# Patient Record
Sex: Male | Born: 1970
Health system: Southern US, Community
[De-identification: ages and names within clinical notes are randomized; demographics above are authoritative.]

## PROBLEM LIST (undated history)

## (undated) DIAGNOSIS — I10 Essential (primary) hypertension: Secondary | ICD-10-CM

## (undated) DIAGNOSIS — Z5189 Encounter for other specified aftercare: Secondary | ICD-10-CM

## (undated) DIAGNOSIS — K5790 Diverticulosis of intestine, part unspecified, without perforation or abscess without bleeding: Secondary | ICD-10-CM

## (undated) DIAGNOSIS — E7800 Pure hypercholesterolemia, unspecified: Secondary | ICD-10-CM

## (undated) DIAGNOSIS — E782 Mixed hyperlipidemia: Secondary | ICD-10-CM

## (undated) DIAGNOSIS — Z8639 Personal history of other endocrine, nutritional and metabolic disease: Secondary | ICD-10-CM

## (undated) HISTORY — PX: EYE SURGERY: SHX253

## (undated) HISTORY — DX: Diverticulosis of intestine, part unspecified, without perforation or abscess without bleeding: K57.90

## (undated) HISTORY — DX: Mixed hyperlipidemia: E78.2

## (undated) HISTORY — DX: Personal history of other endocrine, nutritional and metabolic disease: Z86.39

## (undated) HISTORY — DX: Essential (primary) hypertension: I10

## (undated) HISTORY — DX: Encounter for other specified aftercare: Z51.89

## (undated) HISTORY — PX: UPPER GASTROINTESTINAL ENDOSCOPY: SHX188

## (undated) HISTORY — DX: Pure hypercholesterolemia, unspecified: E78.00

---
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
## 1989-02-21 HISTORY — PX: POSTERIOR CRUCIATE LIGAMENT RECONSTRUCTION: SHX749

## 1998-02-21 DIAGNOSIS — K5733 Diverticulitis of large intestine without perforation or abscess with bleeding: Secondary | ICD-10-CM | POA: Insufficient documentation

## 1998-02-21 HISTORY — PX: COLONOSCOPY: SHX174

## 1998-02-21 HISTORY — PX: COLECTOMY: SHX59

## 1998-02-21 HISTORY — DX: Diverticulitis of large intestine without perforation or abscess with bleeding: K57.33

## 2017-01-11 LAB — HEPATIC FUNCTION PANEL
ALT: 45 — AB (ref 10–40)
AST: 23 (ref 14–40)

## 2017-01-11 LAB — LIPID PANEL
Cholesterol: 147 (ref 0–200)
HDL: 29 — AB (ref 35–70)
LDL Cholesterol: 81

## 2017-01-11 LAB — BASIC METABOLIC PANEL
Creatinine: 0.9 (ref 0.6–1.3)
Glucose: 114

## 2017-01-11 LAB — TSH: TSH: 0.35 — AB (ref 0.41–5.90)

## 2017-01-11 LAB — HEMOGLOBIN A1C: Hemoglobin A1C: 5.5

## 2018-04-25 ENCOUNTER — Encounter: Payer: Self-pay | Admitting: Osteopathic Medicine

## 2018-04-25 ENCOUNTER — Ambulatory Visit: Payer: BLUE CROSS/BLUE SHIELD | Admitting: Osteopathic Medicine

## 2018-04-25 VITALS — BP 145/90 | HR 78 | Temp 98.2°F | Ht 78.0 in | Wt 300.6 lb

## 2018-04-25 DIAGNOSIS — Z8639 Personal history of other endocrine, nutritional and metabolic disease: Secondary | ICD-10-CM | POA: Insufficient documentation

## 2018-04-25 DIAGNOSIS — G8929 Other chronic pain: Secondary | ICD-10-CM

## 2018-04-25 DIAGNOSIS — E782 Mixed hyperlipidemia: Secondary | ICD-10-CM

## 2018-04-25 DIAGNOSIS — Z23 Encounter for immunization: Secondary | ICD-10-CM

## 2018-04-25 DIAGNOSIS — I10 Essential (primary) hypertension: Secondary | ICD-10-CM

## 2018-04-25 DIAGNOSIS — K5790 Diverticulosis of intestine, part unspecified, without perforation or abscess without bleeding: Secondary | ICD-10-CM | POA: Diagnosis not present

## 2018-04-25 DIAGNOSIS — M545 Low back pain: Secondary | ICD-10-CM

## 2018-04-25 HISTORY — DX: Personal history of other endocrine, nutritional and metabolic disease: Z86.39

## 2018-04-25 HISTORY — DX: Essential (primary) hypertension: I10

## 2018-04-25 HISTORY — DX: Mixed hyperlipidemia: E78.2

## 2018-04-25 HISTORY — DX: Diverticulosis of intestine, part unspecified, without perforation or abscess without bleeding: K57.90

## 2018-04-25 MED ORDER — ROSUVASTATIN CALCIUM 20 MG PO TABS: 20.0000 mg | ORAL_TABLET | Freq: Every day | ORAL | 3 refills | Status: DC

## 2018-04-25 MED ORDER — NIACIN ER (ANTIHYPERLIPIDEMIC) 1000 MG PO TBCR: 1000.0000 mg | EXTENDED_RELEASE_TABLET | Freq: Every day | ORAL | 3 refills | Status: DC

## 2018-04-25 MED ORDER — METOPROLOL SUCCINATE ER 25 MG PO TB24: 25.0000 mg | ORAL_TABLET | Freq: Every day | ORAL | 3 refills | Status: DC

## 2018-04-25 MED ORDER — LISINOPRIL-HYDROCHLOROTHIAZIDE 20-25 MG PO TABS: 1.0000 | ORAL_TABLET | Freq: Every day | ORAL | 3 refills | Status: DC

## 2018-04-25 NOTE — Progress Notes (Signed)
HPI: Dylan Vaughn is a 48 y.o. male who  has a past medical history of Diverticular disease (04/25/2018), Essential hypertension (04/25/2018), High blood pressure, High cholesterol, History of thyroid disorder (04/25/2018), and Mixed hyperlipidemia (04/25/2018).  he presents to [facility] today, 04/25/18,  for chief complaint of: New to establish   Very pleasant new patient here to establish care.  Goes by [name].  Works as a Water quality scientist.  He is married.  Fatigue: Ongoing, on and off.  No concerns with depression.  Has noticed decreased libido.  Abnormal blood work: Reports recent blood draw at work showed abnormal fasting glucose, also checked total cholesterol, HDL.  Thyroid: States there was some kind of history of nodules, he cannot remember if thyroid was low functioning or high functioning.  Hypertension: Currently on lisinopril-HCTZ 20-25 and Toprol XL 25 mg daily.  Hyperlipidemia: Taking rosuvastatin 20 mg and niacin 1 g, both in the evenings.   Brief review of preventive care: Never smoker, drinks alcohol on the weekends, maximum of 6/day or so.  Never drug use.  Identifies as cis gender heterosexual male.  Married, monogamous.  Declines STD testing.  Status post vasectomy.  No family history of colon cancer, prostate cancer.  Strong family history of cardiac disease, father had first heart attack at age 79 and was deceased at age 19.     Past medical, surgical, social and family history reviewed:  Patient Active Problem List   Diagnosis Date Noted  . Essential hypertension 04/25/2018  . Mixed hyperlipidemia 04/25/2018  . Diverticular disease 04/25/2018  . History of thyroid disorder 04/25/2018    Past Surgical History:  Procedure Laterality Date  . COLECTOMY  2000  . EYE SURGERY    . POSTERIOR CRUCIATE LIGAMENT RECONSTRUCTION  1991    Social History   Tobacco Use  . Smoking status: Never Smoker  . Smokeless tobacco: Never Used   Substance Use Topics  . Alcohol use: Yes    Alcohol/week: 6.0 standard drinks    Types: 6 Standard drinks or equivalent per week    Family History  Problem Relation Age of Onset  . High blood pressure Father   . Heart attack Father      Current medication list and allergy/intolerance information reviewed:    Current Outpatient Medications  Medication Sig Dispense Refill  . lisinopril-hydrochlorothiazide (PRINZIDE,ZESTORETIC) 20-25 MG tablet Take 1 tablet by mouth daily. 90 tablet 3  . metoprolol succinate (TOPROL-XL) 25 MG 24 hr tablet Take 1 tablet (25 mg total) by mouth daily. 90 tablet 3  . niacin (NIASPAN) 1000 MG CR tablet Take 1 tablet (1,000 mg total) by mouth at bedtime. 90 tablet 3  . rosuvastatin (CRESTOR) 20 MG tablet Take 1 tablet (20 mg total) by mouth at bedtime. 90 tablet 3   No current facility-administered medications for this visit.     Allergies  Allergen Reactions  . Belladonna Alkaloids Other (See Comments)    Tachycardia -  as a child      Review of Systems:  Constitutional:  No  fever, no chills, No recent illness, No unintentional weight changes. +significant fatigue.   HEENT: No  headache, no vision change, no hearing change, No sore throat, No  sinus pressure  Cardiac: No  chest pain, No  pressure, No palpitations, No  Orthopnea  Respiratory:  No  shortness of breath. No  Cough  Gastrointestinal: No  abdominal pain, No  nausea, No  vomiting,  No  blood in  stool, No  diarrhea, No  constipation   Musculoskeletal: No new myalgia/arthralgia, +LBP  Skin: No  Rash, No other wounds/concerning lesions  Genitourinary: No  incontinence, No  abnormal genital bleeding, No abnormal genital discharge  Hem/Onc: No  easy bruising/bleeding, No  abnormal lymph node  Endocrine: No cold intolerance,  No heat intolerance. No polyuria/polydipsia/polyphagia   Neurologic: No  weakness, No  dizziness, No  slurred speech/focal weakness/facial  droop  Psychiatric: No  concerns with depression, No  concerns with anxiety, No sleep problems, No mood problems  Exam:  BP (!) 145/90 (BP Location: Left Arm, Patient Position: Sitting, Cuff Size: Large)   Pulse 78   Temp 98.2 F (36.8 C) (Oral)   Ht 6\' 6"  (1.981 m)   Wt (!) 300 lb 9.6 oz (136.4 kg)   BMI 34.74 kg/m   Constitutional: VS see above. General Appearance: alert, well-developed, well-nourished, NAD  Eyes: Normal lids and conjunctive, non-icteric sclera  Ears, Nose, Mouth, Throat: MMM, Normal external inspection ears/nares/mouth/lips/gums. TM normal bilaterally. Pharynx/tonsils no erythema, no exudate. Nasal mucosa normal.   Neck: No masses, trachea midline. No thyroid enlargement. No tenderness/mass appreciated. No lymphadenopathy  Respiratory: Normal respiratory effort. no wheeze, no rhonchi, no rales  Cardiovascular: S1/S2 normal, no murmur, no rub/gallop auscultated. RRR. No lower extremity edema. Pedal pulse II/IV bilaterally DP and PT.   Gastrointestinal: Nontender, no masses. No hepatomegaly, no splenomegaly. No hernia appreciated. Bowel sounds normal. Rectal exam deferred.   Musculoskeletal: Gait normal. No clubbing/cyanosis of digits.   Neurological: Normal balance/coordination. No tremor. No cranial nerve deficit on limited exam. Motor and sensation intact and symmetric. Cerebellar reflexes intact.   Skin: warm, dry, intact. No rash/ulcer. No concerning nevi or subq nodules on limited exam.    Psychiatric: Normal judgment/insight. Normal mood and affect. Oriented x3.      ASSESSMENT/PLAN: The primary encounter diagnosis was Essential hypertension. Diagnoses of Need for Tdap vaccination, Mixed hyperlipidemia, Diverticular disease, Chronic bilateral low back pain without sciatica, and History of thyroid disorder were also pertinent to this visit.   Orders Placed This Encounter  Procedures  . Tdap vaccine greater than or equal to 7yo IM  . CBC  .  COMPLETE METABOLIC PANEL WITH GFR  . Lipid panel  . TSH  . T4, free  . Hemoglobin A1c  . Testosterone  . Urinalysis, Routine w reflex microscopic    Meds ordered this encounter  Medications  . lisinopril-hydrochlorothiazide (PRINZIDE,ZESTORETIC) 20-25 MG tablet    Sig: Take 1 tablet by mouth daily.    Dispense:  90 tablet    Refill:  3  . metoprolol succinate (TOPROL-XL) 25 MG 24 hr tablet    Sig: Take 1 tablet (25 mg total) by mouth daily.    Dispense:  90 tablet    Refill:  3  . niacin (NIASPAN) 1000 MG CR tablet    Sig: Take 1 tablet (1,000 mg total) by mouth at bedtime.    Dispense:  90 tablet    Refill:  3  . rosuvastatin (CRESTOR) 20 MG tablet    Sig: Take 1 tablet (20 mg total) by mouth at bedtime.    Dispense:  90 tablet    Refill:  3    Patient Instructions  Plan:  Will get labs - fasting, AM As long as labs ok, go back to "regular dose" blood pressure medicines We can have a nurse check blood pressure in a couple weeks to make sure you're at goal (130/80 or less)  OR Can come see me if any significant abnormality on labs which requires follow-up, or if you want to discuss results in detail  If you're doing well, we can recheck "officially" in a year or sooner as needed, but usually I like to know how the blood pressure is looking every 6 months or so. We can schedule you with the nurse (quick visit) for BP check if needed.   Let's get some labs back, and will go from there!           Visit summary with medication list and pertinent instructions was printed for patient to review. All questions at time of visit were answered - patient instructed to contact office with any additional concerns or updates. ER/RTC precautions were reviewed with the patient.  \  Please note: voice recognition software was used to produce this document, and typos may escape review. Please contact Dr. Sheppard Coil for any needed clarifications.     Follow-up plan: Return for  recheck depending on labs / nurse visit in 2 weeks for BP recheck back on meds .

## 2018-04-25 NOTE — Patient Instructions (Addendum)
Plan:  Will get labs - fasting, AM As long as labs ok, go back to "regular dose" blood pressure medicines We can have a nurse check blood pressure in a couple weeks to make sure you're at goal (130/80 or less)  OR Can come see me if any significant abnormality on labs which requires follow-up, or if you want to discuss results in detail  If you're doing well, we can recheck "officially" in a year or sooner as needed, but usually I like to know how the blood pressure is looking every 6 months or so. We can schedule you with the nurse (quick visit) for BP check if needed.   Let's get some labs back, and will go from there!

## 2018-04-27 ENCOUNTER — Encounter: Payer: Self-pay | Admitting: Osteopathic Medicine

## 2018-04-28 LAB — URINALYSIS, ROUTINE W REFLEX MICROSCOPIC
Bacteria, UA: NONE SEEN /HPF
Bilirubin Urine: NEGATIVE
Glucose, UA: NEGATIVE
Hgb urine dipstick: NEGATIVE
Hyaline Cast: NONE SEEN /LPF
Ketones, ur: NEGATIVE
Nitrite: NEGATIVE
Protein, ur: NEGATIVE
RBC / HPF: NONE SEEN /HPF (ref 0–2)
SPECIFIC GRAVITY, URINE: 1.02 (ref 1.001–1.03)
pH: 6.5 (ref 5.0–8.0)

## 2018-04-28 LAB — CBC
HCT: 45.9 % (ref 38.5–50.0)
Hemoglobin: 15.8 g/dL (ref 13.2–17.1)
MCH: 29.7 pg (ref 27.0–33.0)
MCHC: 34.4 g/dL (ref 32.0–36.0)
MCV: 86.3 fL (ref 80.0–100.0)
MPV: 10.8 fL (ref 7.5–12.5)
Platelets: 274 10*3/uL (ref 140–400)
RBC: 5.32 10*6/uL (ref 4.20–5.80)
RDW: 12.8 % (ref 11.0–15.0)
WBC: 8.1 10*3/uL (ref 3.8–10.8)

## 2018-04-28 LAB — LIPID PANEL
CHOL/HDL RATIO: 4.9 (calc) (ref <5.0)
Cholesterol: 156 mg/dL (ref <200)
HDL: 32 mg/dL — ABNORMAL LOW (ref >=40)
LDL Cholesterol (Calc): 95 mg/dL (calc)
Non-HDL Cholesterol (Calc): 124 mg/dL (calc) (ref <130)
Triglycerides: 203 mg/dL — ABNORMAL HIGH (ref <150)

## 2018-04-28 LAB — TESTOSTERONE: Testosterone: 445 ng/dL (ref 250–827)

## 2018-04-28 LAB — COMPLETE METABOLIC PANEL WITH GFR
AG Ratio: 1.7 (calc) (ref 1.0–2.5)
ALT: 28 U/L (ref 9–46)
AST: 19 U/L (ref 10–40)
Albumin: 4.3 g/dL (ref 3.6–5.1)
Alkaline phosphatase (APISO): 52 U/L (ref 36–130)
BUN: 10 mg/dL (ref 7–25)
CO2: 31 mmol/L (ref 20–32)
Calcium: 9.4 mg/dL (ref 8.6–10.3)
Chloride: 100 mmol/L (ref 98–110)
Creat: 0.98 mg/dL (ref 0.60–1.35)
GFR, Est African American: 104 mL/min/{1.73_m2} (ref >=60)
GFR, Est Non African American: 90 mL/min/{1.73_m2} (ref >=60)
Globulin: 2.6 g/dL (calc) (ref 1.9–3.7)
Glucose, Bld: 110 mg/dL — ABNORMAL HIGH (ref 65–99)
Potassium: 4.3 mmol/L (ref 3.5–5.3)
SODIUM: 139 mmol/L (ref 135–146)
Total Bilirubin: 0.9 mg/dL (ref 0.2–1.2)
Total Protein: 6.9 g/dL (ref 6.1–8.1)

## 2018-04-28 LAB — HEMOGLOBIN A1C
EAG (MMOL/L): 6.5 (calc)
Hgb A1c MFr Bld: 5.7 % of total Hgb — ABNORMAL HIGH (ref <5.7)
Mean Plasma Glucose: 117 (calc)

## 2018-04-28 LAB — T4, FREE: Free T4: 0.9 ng/dL (ref 0.8–1.8)

## 2018-04-28 LAB — TSH: TSH: 0.54 mIU/L (ref 0.40–4.50)

## 2018-04-30 ENCOUNTER — Encounter: Payer: Self-pay | Admitting: Osteopathic Medicine

## 2018-04-30 DIAGNOSIS — R7309 Other abnormal glucose: Secondary | ICD-10-CM

## 2018-04-30 HISTORY — DX: Other abnormal glucose: R73.09

## 2018-05-22 ENCOUNTER — Telehealth: Payer: Self-pay | Admitting: Osteopathic Medicine

## 2018-05-22 DIAGNOSIS — R7302 Impaired glucose tolerance (oral): Secondary | ICD-10-CM

## 2018-05-22 DIAGNOSIS — Z8639 Personal history of other endocrine, nutritional and metabolic disease: Secondary | ICD-10-CM

## 2018-05-22 HISTORY — DX: Impaired glucose tolerance (oral): R73.02

## 2018-05-22 NOTE — Telephone Encounter (Signed)
As reviewed from previous provider in [place].

## 2018-05-31 ENCOUNTER — Encounter: Payer: Self-pay | Admitting: Osteopathic Medicine

## 2018-08-21 ENCOUNTER — Encounter: Payer: Self-pay | Admitting: Osteopathic Medicine

## 2018-08-21 ENCOUNTER — Ambulatory Visit (INDEPENDENT_AMBULATORY_CARE_PROVIDER_SITE_OTHER): Payer: BC Managed Care – PPO | Admitting: Osteopathic Medicine

## 2018-08-21 VITALS — BP 140/85 | HR 79 | Temp 98.4°F | Wt 307.3 lb

## 2018-08-21 DIAGNOSIS — E781 Pure hyperglyceridemia: Secondary | ICD-10-CM

## 2018-08-21 DIAGNOSIS — R7309 Other abnormal glucose: Secondary | ICD-10-CM

## 2018-08-21 DIAGNOSIS — E782 Mixed hyperlipidemia: Secondary | ICD-10-CM

## 2018-08-21 DIAGNOSIS — I10 Essential (primary) hypertension: Secondary | ICD-10-CM | POA: Diagnosis not present

## 2018-08-21 HISTORY — DX: Pure hyperglyceridemia: E78.1

## 2018-08-21 NOTE — Progress Notes (Signed)
HPI: Dylan Vaughn is a 48 y.o. male who  has a past medical history of Diverticular disease (04/25/2018), Essential hypertension (04/25/2018), High blood pressure, High cholesterol, History of thyroid disorder (04/25/2018), and Mixed hyperlipidemia (04/25/2018).  he presents to [facility] today, 08/21/18,  for chief complaint of:  Follow-up HTN   Bit upset today, there was a good with scheduling and he was here early and waiting for awhile.   No CP/SOB, doing well on current meds.   Reviewed labs, all questions answered.   A1C was prediabetic range.    BP Readings from Last 3 Encounters:  08/21/18 140/85  04/25/18 (!) 145/90       At today's visit 08/21/18 ... PMH, PSH, FH reviewed and updated as needed.  Current medication list and allergy/intolerance hx reviewed and updated as needed. (See remainder of HPI, ROS, Phys Exam below)          ASSESSMENT/PLAN: The primary encounter diagnosis was Elevated hemoglobin A1c measurement. Diagnoses of Essential hypertension, Mixed hyperlipidemia, and Hypertriglyceridemia were also pertinent to this visit.   Work on [employer] and exercise   Pt will get a home BP monitor and check at home, a bit stressed today so we'll let BP slide but don't want to ignore it   Patient Instructions  BP goal: 130 top number, 80 bottom number or less       Follow-up plan: Return in about 3 months (around 11/21/2018) for recheck A1C (prediabetic range in 04/2018) - see me sooner if needed.                                                 ################################################# ################################################# ################################################# #################################################    Current Meds  Medication Sig  . lisinopril-hydrochlorothiazide (PRINZIDE,ZESTORETIC) 20-25 MG tablet Take 1 tablet by mouth  daily.  . metoprolol succinate (TOPROL-XL) 25 MG 24 hr tablet Take 1 tablet (25 mg total) by mouth daily.  . niacin (NIASPAN) 1000 MG CR tablet Take 1 tablet (1,000 mg total) by mouth at bedtime.  . rosuvastatin (CRESTOR) 20 MG tablet Take 1 tablet (20 mg total) by mouth at bedtime.    Allergies  Allergen Reactions  . Belladonna Alkaloids Other (See Comments)    Tachycardia -  as a child       Review of Systems:  Constitutional: No recent illness  HEENT: No  headache, no vision change  Cardiac: No  chest pain, No  pressure, No palpitations  Respiratory:  No  shortness of breath. No  Cough  Gastrointestinal: No  abdominal pain, no change on bowel habits  Musculoskeletal: No new myalgia/arthralgia  Skin: No  Rash  Psychiatric: No  concerns with depression, No  concerns with anxiety  Exam:  BP 140/85   Pulse 79   Temp 98.4 F (36.9 C) (Oral)   Wt (!) 307 lb 4.8 oz (139.4 kg)   BMI 35.51 kg/m   Constitutional: VS see above. General Appearance: alert, well-developed, well-nourished, NAD  Eyes: Normal lids and conjunctive, non-icteric sclera  Ears, Nose, Mouth, Throat: MMM, Normal external inspection ears/nares/mouth/lips/gums.  Neck: No masses, trachea midline.   Respiratory: Normal respiratory effort.  Musculoskeletal: Gait normal. Symmetric and independent movement of all extremities  Neurological: Normal balance/coordination. No tremor.  Skin: warm, dry, intact.   Psychiatric: Normal judgment/insight. Normal mood and affect. Oriented x3.  Visit summary with medication list and pertinent instructions was printed for patient to review, patient was advised to alert [place] if any updates are needed. All questions at time of visit were answered - patient instructed to contact office with any additional concerns. ER/RTC precautions were reviewed with the patient and understanding verbalized.     Please note: voice recognition software was used to produce  this document, and typos may escape review. Please contact Dr. Sheppard Coil for any needed clarifications.    Follow up plan: Return in about 3 months (around 11/21/2018) for recheck A1C (prediabetic range in 04/2018) - see me sooner if needed.

## 2018-08-21 NOTE — Patient Instructions (Signed)
BP goal: 130 top number, 80 bottom number or less

## 2018-10-31 DIAGNOSIS — Z20828 Contact with and (suspected) exposure to other viral communicable diseases: Secondary | ICD-10-CM | POA: Diagnosis not present

## 2018-11-21 ENCOUNTER — Ambulatory Visit: Payer: BC Managed Care – PPO | Admitting: Osteopathic Medicine

## 2018-11-26 ENCOUNTER — Telehealth: Payer: Self-pay | Admitting: Osteopathic Medicine

## 2018-11-26 DIAGNOSIS — E782 Mixed hyperlipidemia: Secondary | ICD-10-CM

## 2018-11-26 DIAGNOSIS — R7309 Other abnormal glucose: Secondary | ICD-10-CM

## 2018-11-26 NOTE — Telephone Encounter (Signed)
Patient was wanting lab order placed prior to his visit on 12/26/2018. Please contact patient when orders are placed. Please Advise.

## 2018-11-27 ENCOUNTER — Ambulatory Visit: Payer: BC Managed Care – PPO | Admitting: Osteopathic Medicine

## 2018-11-27 NOTE — Telephone Encounter (Signed)
Taken care of

## 2018-11-27 NOTE — Telephone Encounter (Signed)
Left a detailed vm msg for pt regarding lab order. Direct call back info provided.  

## 2018-11-29 ENCOUNTER — Other Ambulatory Visit: Payer: Self-pay

## 2018-11-29 DIAGNOSIS — Z20822 Contact with and (suspected) exposure to covid-19: Secondary | ICD-10-CM

## 2018-11-29 DIAGNOSIS — Z20828 Contact with and (suspected) exposure to other viral communicable diseases: Secondary | ICD-10-CM | POA: Diagnosis not present

## 2018-11-30 LAB — NOVEL CORONAVIRUS, NAA: SARS-CoV-2, NAA: NOT DETECTED

## 2018-12-25 LAB — COMPLETE METABOLIC PANEL WITH GFR
AG Ratio: 1.6 (calc) (ref 1.0–2.5)
ALT: 30 U/L (ref 9–46)
AST: 20 U/L (ref 10–35)
Albumin: 4.3 g/dL (ref 3.6–5.1)
Alkaline phosphatase (APISO): 49 U/L (ref 35–144)
BUN: 9 mg/dL (ref 7–25)
CO2: 29 mmol/L (ref 20–32)
Calcium: 9.1 mg/dL (ref 8.6–10.3)
Chloride: 101 mmol/L (ref 98–110)
Creat: 0.78 mg/dL (ref 0.70–1.33)
GFR, Est African American: 122 mL/min/{1.73_m2} (ref >=60)
GFR, Est Non African American: 105 mL/min/{1.73_m2} (ref >=60)
Globulin: 2.7 g/dL (calc) (ref 1.9–3.7)
Glucose, Bld: 129 mg/dL — ABNORMAL HIGH (ref 65–99)
Potassium: 4 mmol/L (ref 3.5–5.3)
Sodium: 140 mmol/L (ref 135–146)
Total Bilirubin: 1 mg/dL (ref 0.2–1.2)
Total Protein: 7 g/dL (ref 6.1–8.1)

## 2018-12-25 LAB — LIPID PANEL
Cholesterol: 128 mg/dL (ref <200)
HDL: 38 mg/dL — ABNORMAL LOW (ref >=40)
LDL Cholesterol (Calc): 62 mg/dL (calc)
Non-HDL Cholesterol (Calc): 90 mg/dL (calc) (ref <130)
Total CHOL/HDL Ratio: 3.4 (calc) (ref <5.0)
Triglycerides: 223 mg/dL — ABNORMAL HIGH (ref <150)

## 2018-12-25 LAB — HEMOGLOBIN A1C
Hgb A1c MFr Bld: 5.4 % of total Hgb (ref <5.7)
Mean Plasma Glucose: 108 (calc)
eAG (mmol/L): 6 (calc)

## 2018-12-26 ENCOUNTER — Encounter: Payer: Self-pay | Admitting: Gastroenterology

## 2018-12-26 ENCOUNTER — Encounter: Payer: Self-pay | Admitting: Osteopathic Medicine

## 2018-12-26 ENCOUNTER — Ambulatory Visit (INDEPENDENT_AMBULATORY_CARE_PROVIDER_SITE_OTHER): Payer: BC Managed Care – PPO | Admitting: Osteopathic Medicine

## 2018-12-26 VITALS — BP 136/85 | HR 75 | Wt 305.0 lb

## 2018-12-26 DIAGNOSIS — E781 Pure hyperglyceridemia: Secondary | ICD-10-CM

## 2018-12-26 DIAGNOSIS — Z1211 Encounter for screening for malignant neoplasm of colon: Secondary | ICD-10-CM | POA: Diagnosis not present

## 2018-12-26 DIAGNOSIS — Z9049 Acquired absence of other specified parts of digestive tract: Secondary | ICD-10-CM

## 2018-12-26 DIAGNOSIS — R7302 Impaired glucose tolerance (oral): Secondary | ICD-10-CM

## 2018-12-26 DIAGNOSIS — R7309 Other abnormal glucose: Secondary | ICD-10-CM | POA: Diagnosis not present

## 2018-12-26 DIAGNOSIS — Z0000 Encounter for general adult medical examination without abnormal findings: Secondary | ICD-10-CM

## 2018-12-26 MED ORDER — ICOSAPENT ETHYL 1 G PO CAPS: 1.0000 | ORAL_CAPSULE | Freq: Two times a day (BID) | ORAL | 3 refills | Status: DC

## 2018-12-26 NOTE — Progress Notes (Signed)
Virtual Visit via Video (App used: Doximity) Note  I connected with      Dylan Vaughn on 12/26/18 at 8:53 AM by a telemedicine application and verified that I am speaking with the correct person using two identifiers.  Patient is at home I am working form home    I discussed the limitations of evaluation and management by telemedicine and the availability of in person appointments. The patient expressed understanding and agreed to proceed.  History of Present Illness: Dylan Vaughn is a 48 y.o. male who would like to discuss recheck A1C, cholesterol, discuss colon cancer screening   A1C went from 5.7 in 04/2000 to 5.4 this month 12/2018. Doing well!  Lipids: better, HDL is highest it's been for him based onhis records going back several years. He also has had TG 470+ and this wsa iniial reason for starting the Niacin, which has helped but TG are still fairly high.   48 yo now! Would like to discuss colonoscopy. Hx colectomy d/t ruptured diverticulum.       Observations/Objective: There were no vitals taken for this visit. BP Readings from Last 3 Encounters:  08/21/18 140/85  04/25/18 (!) 145/90   Exam: Normal Speech.  NAD  Lab and Radiology Results Results for orders placed or performed in visit on 11/26/18 (from the past 72 hour(s))  Lipid Profile     Status: Abnormal   Collection Time: 12/24/18  8:20 AM  Result Value Ref Range   Cholesterol 128 <200 mg/dL   HDL 38 (L) > OR = 40 mg/dL   Triglycerides 223 (H) <150 mg/dL    Comment: . If a non-fasting specimen was collected, consider repeat triglyceride testing on a fasting specimen if clinically indicated.  Yates Decamp et al. J. of Clin. Lipidol. [id number]. Marland Kitchen    LDL Cholesterol (Calc) 62 mg/dL (calc)    Comment: Reference range: <100 . Desirable range <100 mg/dL for primary prevention;   <70 mg/dL for patients with CHD or diabetic patients  with > or = 2 CHD risk factors. Marland Kitchen LDL-C is now calculated using  the Martin-Hopkins  calculation, which is a validated novel method providing  better accuracy than the Friedewald equation in the  estimation of LDL-C.  Cresenciano Genre et al. Annamaria Helling. [id number]): 2061-2068  (http://education.[facility].com/faq/FAQ164)    Total CHOL/HDL Ratio 3.4 <5.0 (calc)   Non-HDL Cholesterol (Calc) 90 <130 mg/dL (calc)    Comment: For patients with diabetes plus 1 major ASCVD risk  factor, treating to a non-HDL-C goal of <100 mg/dL  (LDL-C of <70 mg/dL) is considered a therapeutic  option.   COMPLETE METABOLIC PANEL WITH GFR     Status: Abnormal   Collection Time: 12/24/18  8:20 AM  Result Value Ref Range   Glucose, Bld 129 (H) 65 - 99 mg/dL    Comment: .            Fasting reference interval . For someone without known diabetes, a glucose value >125 mg/dL indicates that they may have diabetes and this should be confirmed with a follow-up test. .    BUN 9 7 - 25 mg/dL   Creat 0.78 0.70 - 1.33 mg/dL    Comment: For patients >48 years of age, the reference limit for Creatinine is approximately 13% higher for people identified as African-American. .    GFR, Est Non African American 105 > OR = 60 mL/min/1.54m2   GFR, Est African American 122 > OR = 60 mL/min/1.9m2   BUN/Creatinine Ratio NOT  APPLICABLE 6 - 22 (calc)   Sodium 140 135 - 146 mmol/L   Potassium 4.0 3.5 - 5.3 mmol/L   Chloride 101 98 - 110 mmol/L   CO2 29 20 - 32 mmol/L   Calcium 9.1 8.6 - 10.3 mg/dL   Total Protein 7.0 6.1 - 8.1 g/dL   Albumin 4.3 3.6 - 5.1 g/dL   Globulin 2.7 1.9 - 3.7 g/dL (calc)   AG Ratio 1.6 1.0 - 2.5 (calc)   Total Bilirubin 1.0 0.2 - 1.2 mg/dL   Alkaline phosphatase (APISO) 49 35 - 144 U/L   AST 20 10 - 35 U/L   ALT 30 9 - 46 U/L  HgB A1c     Status: None   Collection Time: 12/24/18  8:20 AM  Result Value Ref Range   Hgb A1c MFr Bld 5.4 <5.7 % of total Hgb    Comment: For the purpose of screening for the presence of diabetes: . <5.7%       Consistent with  the absence of diabetes 5.7-6.4%    Consistent with increased risk for diabetes             (prediabetes) > or =6.5%  Consistent with diabetes . This assay result is consistent with a decreased risk of diabetes. . Currently, no consensus exists regarding use of hemoglobin A1c for diagnosis of diabetes in children. . According to American Diabetes Association (ADA) guidelines, hemoglobin A1c <7.0% represents optimal control in non-pregnant diabetic patients. Different metrics may apply to specific patient populations.  Standards of Medical Care in Diabetes(ADA). .    Mean Plasma Glucose 108 (calc)   eAG (mmol/L) 6.0 (calc)   No results found.     Assessment and Plan: 48 y.o. male with The primary encounter diagnosis was Hypertriglyceridemia. Diagnoses of Impaired glucose tolerance, Elevated hemoglobin A1c measurement, Colon cancer screening, History of colectomy, and Annual physical exam were also pertinent to this visit.  Labs ordered for future visit. Annual physical / preventive care was NOT performed or billed today.   PDMP not reviewed this encounter. Orders Placed This Encounter  Procedures  . CBC  . COMPLETE METABOLIC PANEL WITH GFR  . Lipid panel  . Hemoglobin A1c  . Ambulatory referral to Gastroenterology    Referral Priority:   Routine    Referral Type:   Consultation    Referral Reason:   Specialty Services Required    Number of Visits Requested:   1   Meds ordered this encounter  Medications  . Icosapent Ethyl 1 g CAPS    Sig: Take 1 capsule (1 g total) by mouth 2 (two) times daily.    Dispense:  180 capsule    Refill:  3    Send [place] if needed, history of TG 470+   There are no Patient Instructions on file for this visit.  Instructions sent via MyChart. If MyChart not available, pt was given option for info via personal e-mail w/ no guarantee of protected health info over unsecured e-mail communication, and MyChart sign-up instructions were included.    Follow Up Instructions: Return in about 6 months (around 06/25/2019) for [place] (get labs prior to visit, orders are in) .    I discussed the assessment and treatment plan with the patient. The patient was provided an opportunity to ask questions and all were answered. The patient agreed with the plan and demonstrated an understanding of the instructions.   The patient was advised to call back or seek an in-person evaluation  if any new concerns, if symptoms worsen or if the condition fails to improve as anticipated.  25 minutes of non-face-to-face time was provided during this encounter.                      Historical information moved to improve visibility of documentation.  Past Medical History:  Diagnosis Date  . Diverticular disease 04/25/2018  . Essential hypertension 04/25/2018  . High blood pressure   . High cholesterol   . History of thyroid disorder 04/25/2018   S/p biopsy WNL  . Mixed hyperlipidemia 04/25/2018   Past Surgical History:  Procedure Laterality Date  . COLECTOMY  2000   Ruptured diverticulum  . EYE SURGERY    . POSTERIOR CRUCIATE LIGAMENT RECONSTRUCTION  1991   Social History   Tobacco Use  . Smoking status: Never Smoker  . Smokeless tobacco: Never Used  Substance Use Topics  . Alcohol use: Yes    Alcohol/week: 6.0 standard drinks    Types: 6 Standard drinks or equivalent per week   family history includes Heart attack in his father; High blood pressure in his father.  Medications: Current Outpatient Medications  Medication Sig Dispense Refill  . lisinopril-hydrochlorothiazide (PRINZIDE,ZESTORETIC) 20-25 MG tablet Take 1 tablet by mouth daily. 90 tablet 3  . metoprolol succinate (TOPROL-XL) 25 MG 24 hr tablet Take 1 tablet (25 mg total) by mouth daily. 90 tablet 3  . niacin (NIASPAN) 1000 MG CR tablet Take 1 tablet (1,000 mg total) by mouth at bedtime. 90 tablet 3  . rosuvastatin (CRESTOR) 20 MG tablet Take 1 tablet (20 mg total) by  mouth at bedtime. 90 tablet 3   No current facility-administered medications for this visit.    Allergies  Allergen Reactions  . Belladonna Alkaloids Other (See Comments)    Tachycardia -  as a child    PDMP not reviewed this encounter. No orders of the defined types were placed in this encounter.  No orders of the defined types were placed in this encounter.

## 2019-01-22 ENCOUNTER — Other Ambulatory Visit: Payer: Self-pay

## 2019-01-22 ENCOUNTER — Ambulatory Visit (AMBULATORY_SURGERY_CENTER): Payer: BC Managed Care – PPO | Admitting: *Deleted

## 2019-01-22 ENCOUNTER — Encounter: Payer: Self-pay | Admitting: Gastroenterology

## 2019-01-22 VITALS — Temp 97.1°F | Ht 78.0 in | Wt 312.0 lb

## 2019-01-22 DIAGNOSIS — Z1211 Encounter for screening for malignant neoplasm of colon: Secondary | ICD-10-CM

## 2019-01-22 DIAGNOSIS — Z1159 Encounter for screening for other viral diseases: Secondary | ICD-10-CM

## 2019-01-22 MED ORDER — SUPREP BOWEL PREP KIT 17.5-3.13-1.6 GM/177ML PO SOLN: 1.0000 | Freq: Once | ORAL | 0 refills | Status: AC

## 2019-01-22 NOTE — Progress Notes (Signed)
Pt has traveled to [place]- back Sunday 01-20-2019  No egg or soy allergy known to patient  No issues with past sedation with any surgeries  or procedures, no intubation problems  No diet pills per patient No home 02 use per patient  No blood thinners per patient  Pt denies issues with constipation  No A fib or A flutter  EMMI video sent to pt's e mail   Due to the COVID-19 pandemic we are asking patients to follow these guidelines. Please only bring one care partner. Please be aware that your care partner may wait in the car in the parking lot or if they feel like they will be too hot to wait in the car, they may wait in the lobby on the 4th floor. All care partners are required to wear a mask the entire time (we do not have any that we can provide them), they need to practice social distancing, and we will do a Covid check for all patient's and care partners when you arrive. Also we will check their temperature and your temperature. If the care partner waits in their car they need to stay in the parking lot the entire time and we will call them on their cell phone when the patient is ready for discharge so they can bring the car to the front of the building. Also all patient's will need to wear a mask into building.  [employer] Coupon $15

## 2019-01-29 ENCOUNTER — Other Ambulatory Visit: Payer: Self-pay | Admitting: Gastroenterology

## 2019-01-29 ENCOUNTER — Ambulatory Visit (INDEPENDENT_AMBULATORY_CARE_PROVIDER_SITE_OTHER): Payer: BC Managed Care – PPO

## 2019-01-29 DIAGNOSIS — Z1159 Encounter for screening for other viral diseases: Secondary | ICD-10-CM

## 2019-01-30 LAB — SARS CORONAVIRUS 2 (TAT 6-24 HRS): SARS Coronavirus 2: NEGATIVE

## 2019-02-01 ENCOUNTER — Encounter: Payer: Self-pay | Admitting: Gastroenterology

## 2019-02-01 ENCOUNTER — Ambulatory Visit (AMBULATORY_SURGERY_CENTER): Payer: BC Managed Care – PPO | Admitting: Gastroenterology

## 2019-02-01 ENCOUNTER — Other Ambulatory Visit: Payer: Self-pay

## 2019-02-01 VITALS — BP 127/81 | HR 76 | Temp 97.6°F | Resp 18 | Ht 78.0 in | Wt 305.0 lb

## 2019-02-01 DIAGNOSIS — K621 Rectal polyp: Secondary | ICD-10-CM | POA: Diagnosis not present

## 2019-02-01 DIAGNOSIS — D125 Benign neoplasm of sigmoid colon: Secondary | ICD-10-CM

## 2019-02-01 DIAGNOSIS — K5730 Diverticulosis of large intestine without perforation or abscess without bleeding: Secondary | ICD-10-CM

## 2019-02-01 DIAGNOSIS — Z1211 Encounter for screening for malignant neoplasm of colon: Secondary | ICD-10-CM | POA: Diagnosis not present

## 2019-02-01 DIAGNOSIS — Z8371 Family history of colonic polyps: Secondary | ICD-10-CM

## 2019-02-01 DIAGNOSIS — K640 First degree hemorrhoids: Secondary | ICD-10-CM

## 2019-02-01 MED ORDER — SODIUM CHLORIDE 0.9 % IV SOLN: 500.0000 mL | Freq: Once | INTRAVENOUS | Status: DC

## 2019-02-01 NOTE — Op Note (Signed)
[place] Patient Name: Dylan Vaughn Procedure Date: 02/01/2019 11:34 AM MRN: [id number] Endoscopist: Gerrit Heck , MD Age: 48 Referring MD:  Date of Birth: [birth year] Gender: Male Account #: [id number] Procedure:                Colonoscopy Indications:              Screening for colorectal malignant neoplasm                           History of severe diverticular bleed 20 years ago,                            requiring right hemicolectomy. Sister with colon                            polyps, but otherwise no FHx of CRC. He is                            otherwise without lower GI symptoms. Medicines:                Monitored Anesthesia Care Procedure:                Pre-Anesthesia Assessment:                           - Prior to the procedure, a History and Physical                            was performed, and patient medications and                            allergies were reviewed. The patient's tolerance of                            previous anesthesia was also reviewed. The risks                            and benefits of the procedure and the sedation                            options and risks were discussed with the patient.                            All questions were answered, and informed consent                            was obtained. Prior Anticoagulants: The patient has                            taken no previous anticoagulant or antiplatelet                            agents. ASA Grade Assessment: II - A patient with  mild systemic disease. After reviewing the risks                            and benefits, the patient was deemed in                            satisfactory condition to undergo the procedure.                           After obtaining informed consent, the colonoscope                            was passed under direct vision. Throughout the                            procedure, the patient's blood pressure,  pulse, and                            oxygen saturations were monitored continuously. The                            Colonoscope was introduced through the anus and                            advanced to the the ileocolonic anastomosis. The                            colonoscopy was performed without difficulty. The                            patient tolerated the procedure well. The quality                            of the bowel preparation was adequate. The terminal                            ileum and the rectum were photographed. Scope In: 11:43:00 AM Scope Out: 12:00:33 PM Scope Withdrawal Time: 0 hours 15 minutes 20 seconds  Total Procedure Duration: 0 hours 17 minutes 33 seconds  Findings:                 The perianal and digital rectal examinations were                            normal.                           There was evidence of a prior end-to-side                            ileo-colonic anastomosis in the transverse colon.                            This was patent and was characterized by healthy  appearing mucosa. The anastomosis was traversed.                           Multiple small and large-mouthed diverticula were                            found in the sigmoid colon and descending colon.                           Two sessile polyps were found in the sigmoid colon.                            The polyps were 3 to 5 mm in size. These polyps                            were removed with a cold snare. Resection and                            retrieval were complete. Estimated blood loss was                            minimal.                           A patchy area of granular mucosa was found in the                            rectum. Biopsies were taken with a cold forceps for                            histology. Estimated blood loss was minimal.                           The neo-terminal ileum appeared normal.                            Non-bleeding internal hemorrhoids were found during                            retroflexion. The hemorrhoids were small. Complications:            No immediate complications. Estimated Blood Loss:     Estimated blood loss was minimal. Impression:               - Patent end-to-side ileo-colonic anastomosis,                            characterized by healthy appearing mucosa.                           - Diverticulosis in the sigmoid colon and in the                            descending colon.                           -  Two 3 to 5 mm polyps in the sigmoid colon,                            removed with a cold snare. Resected and retrieved.                           - Granularity in the rectum. Biopsied.                           - The examined portion of the ileum was normal.                           - Non-bleeding internal hemorrhoids. Recommendation:           - Patient has a contact number available for                            emergencies. The signs and symptoms of potential                            delayed complications were discussed with the                            patient. Return to normal activities tomorrow.                            Written discharge instructions were provided to the                            patient.                           - Resume previous diet.                           - Continue present medications.                           - Await pathology results.                           - Repeat colonoscopy in 5 years for surveillance.                           - Return to GI office PRN.                           - Use fiber, for example Citrucel, Fibercon, Konsyl                            or Metamucil. Gerrit Heck, MD 02/01/2019 12:12:39 PM

## 2019-02-01 NOTE — Progress Notes (Signed)
VS-KA Temp-JR  Pt's states no medical or surgical changes since previsit or office visit.

## 2019-02-01 NOTE — Patient Instructions (Signed)
HANDOUTS PROVIDED ON: POLYPS, DIVERTICULOSIS, & HEMORRHOIDS  THE POLYPS REMOVED TODAY HAVE BEEN SENT FOR PATHOLOGY.  THE RESULTS CAN TAKE 2-3 WEEKS TO RECEIVE.   YOU MAY RESUME YOUR PREVIOUS DIET AND MEDICATION SCHEDULE.  ADD A FIBER SUPPLEMENT SUCH CITRUCEL, FIBERCON, KONSYL, OR METAMUCIL ALL CAN BE FOUND OVER THE COUNTER.  [place] YOU FOR ALLOWING [place] TO CARE FOR YOU TODAY!!!  YOU HAD AN ENDOSCOPIC PROCEDURE TODAY AT [facility]:   Refer to the procedure report that was given to you for any specific questions about what was found during the examination.  If the procedure report does not answer your questions, please call your gastroenterologist to clarify.  If you requested that your care partner not be given the details of your procedure findings, then the procedure report has been included in a sealed envelope for you to review at your convenience later.  YOU SHOULD EXPECT: Some feelings of bloating in the abdomen. Passage of more gas than usual.  Walking can help get rid of the air that was put into your GI tract during the procedure and reduce the bloating. If you had a lower endoscopy (such as a colonoscopy or flexible sigmoidoscopy) you may notice spotting of blood in your stool or on the toilet paper. If you underwent a bowel prep for your procedure, you may not have a normal bowel movement for a few days.  Please Note:  You might notice some irritation and congestion in your nose or some drainage.  This is from the oxygen used during your procedure.  There is no need for concern and it should clear up in a day or so.  SYMPTOMS TO REPORT IMMEDIATELY:   Following lower endoscopy (colonoscopy or flexible sigmoidoscopy):  Excessive amounts of blood in the stool  Significant tenderness or worsening of abdominal pains  Swelling of the abdomen that is new, acute  Fever of 100F or higher  For urgent or emergent issues, a gastroenterologist can be reached at any hour by  calling [phone].   DIET:  We do recommend a small meal at first, but then you may proceed to your regular diet.  Drink plenty of fluids but you should avoid alcoholic beverages for 24 hours.  ACTIVITY:  You should plan to take it easy for the rest of today and you should NOT DRIVE or use heavy machinery until tomorrow (because of the sedation medicines used during the test).    FOLLOW UP: Our staff will call the number listed on your records 48-72 hours following your procedure to check on you and address any questions or concerns that you may have regarding the information given to you following your procedure. If we do not reach you, we will leave a message.  We will attempt to reach you two times.  During this call, we will ask if you have developed any symptoms of COVID 19. If you develop any symptoms (ie: fever, flu-like symptoms, shortness of breath, cough etc.) before then, please call [phone].  If you test positive for Covid 19 in the 2 weeks post procedure, please call and report this information to [place].    If any biopsies were taken you will be contacted by phone or by letter within the next 1-3 weeks.  Please call us at [phone] if you have not heard about the biopsies in 3 weeks.    SIGNATURES/CONFIDENTIALITY: You and/or your care partner have signed paperwork which will be entered into your electronic medical record.  These signatures attest to the fact that that the information above on your After Visit Summary has been reviewed and is understood.  Full responsibility of the confidentiality of this discharge information lies with you and/or your care-partner.

## 2019-02-01 NOTE — Progress Notes (Signed)
Called to room to assist during endoscopic procedure.  Patient ID and intended procedure confirmed with present staff. Received instructions for my participation in the procedure from the performing physician.  

## 2019-02-03 ENCOUNTER — Encounter (HOSPITAL_BASED_OUTPATIENT_CLINIC_OR_DEPARTMENT_OTHER): Payer: Self-pay | Admitting: Emergency Medicine

## 2019-02-03 ENCOUNTER — Emergency Department (HOSPITAL_BASED_OUTPATIENT_CLINIC_OR_DEPARTMENT_OTHER)
Admission: EM | Admit: 2019-02-03 | Discharge: 2019-02-03 | Disposition: A | Discharge status: Home / Self Care | Payer: BC Managed Care – PPO | Attending: Emergency Medicine | Admitting: Emergency Medicine

## 2019-02-03 ENCOUNTER — Other Ambulatory Visit: Payer: Self-pay

## 2019-02-03 DIAGNOSIS — Z79899 Other long term (current) drug therapy: Secondary | ICD-10-CM | POA: Insufficient documentation

## 2019-02-03 DIAGNOSIS — I10 Essential (primary) hypertension: Secondary | ICD-10-CM | POA: Diagnosis not present

## 2019-02-03 DIAGNOSIS — I4891 Unspecified atrial fibrillation: Secondary | ICD-10-CM | POA: Diagnosis not present

## 2019-02-03 DIAGNOSIS — R002 Palpitations: Secondary | ICD-10-CM | POA: Diagnosis not present

## 2019-02-03 LAB — CBC
HCT: 46.3 % (ref 39.0–52.0)
Hemoglobin: 15.6 g/dL (ref 13.0–17.0)
MCH: 29.8 pg (ref 26.0–34.0)
MCHC: 33.7 g/dL (ref 30.0–36.0)
MCV: 88.4 fL (ref 80.0–100.0)
Platelets: 253 10*3/uL (ref 150–400)
RBC: 5.24 MIL/uL (ref 4.22–5.81)
RDW: 12.2 % (ref 11.5–15.5)
WBC: 14.5 10*3/uL — ABNORMAL HIGH (ref 4.0–10.5)
nRBC: 0 % (ref 0.0–0.2)

## 2019-02-03 LAB — COMPREHENSIVE METABOLIC PANEL
ALT: 31 U/L (ref 0–44)
AST: 23 U/L (ref 15–41)
Albumin: 4.1 g/dL (ref 3.5–5.0)
Alkaline Phosphatase: 59 U/L (ref 38–126)
Anion gap: 10 (ref 5–15)
BUN: 7 mg/dL (ref 6–20)
CO2: 27 mmol/L (ref 22–32)
Calcium: 8.8 mg/dL — ABNORMAL LOW (ref 8.9–10.3)
Chloride: 101 mmol/L (ref 98–111)
Creatinine, Ser: 0.82 mg/dL (ref 0.61–1.24)
GFR calc Af Amer: >60 mL/min (ref >60)
GFR calc non Af Amer: >60 mL/min (ref >60)
Glucose, Bld: 147 mg/dL — ABNORMAL HIGH (ref 70–99)
Potassium: 3.4 mmol/L — ABNORMAL LOW (ref 3.5–5.1)
Sodium: 138 mmol/L (ref 135–145)
Total Bilirubin: 0.9 mg/dL (ref 0.3–1.2)
Total Protein: 7.5 g/dL (ref 6.5–8.1)

## 2019-02-03 MED ORDER — DILTIAZEM HCL 25 MG/5ML IV SOLN
INTRAVENOUS | Status: AC
  Filled 2019-02-03: qty 5

## 2019-02-03 MED ORDER — DILTIAZEM LOAD VIA INFUSION
20.0000 mg | Freq: Once | INTRAVENOUS | Status: DC
  Filled 2019-02-03: qty 20

## 2019-02-03 MED ORDER — METOPROLOL TARTRATE 50 MG PO TABS
25.0000 mg | ORAL_TABLET | Freq: Once | ORAL | Status: AC
  Administered 2019-02-03: 25 mg via ORAL
  Filled 2019-02-03: qty 1

## 2019-02-03 MED ORDER — DILTIAZEM HCL 100 MG IV SOLR: 5.0000 mg/h | INTRAVENOUS | Status: DC

## 2019-02-03 MED ORDER — SODIUM CHLORIDE 0.9 % IV BOLUS
1000.0000 mL | Freq: Once | INTRAVENOUS | Status: AC
  Administered 2019-02-03: 1000 mL via INTRAVENOUS

## 2019-02-03 MED ORDER — POTASSIUM CHLORIDE CRYS ER 20 MEQ PO TBCR
40.0000 meq | EXTENDED_RELEASE_TABLET | Freq: Once | ORAL | Status: AC
  Administered 2019-02-03: 40 meq via ORAL
  Filled 2019-02-03: qty 2

## 2019-02-03 MED ORDER — DILTIAZEM HCL 100 MG IV SOLR
INTRAVENOUS | Status: AC
  Filled 2019-02-03: qty 100

## 2019-02-03 MED ORDER — POTASSIUM CHLORIDE CRYS ER 20 MEQ PO TBCR: 20.0000 meq | EXTENDED_RELEASE_TABLET | Freq: Every day | ORAL | 0 refills | Status: DC

## 2019-02-03 NOTE — Discharge Instructions (Addendum)
It was our pleasure to provide your ER care today - we hope that you feel better.  Continue your metoprolol. Minimize caffeine and/or alcohol use.   From today's lab tests, your potassium level is mildly low (3.4)  - eat plenty of fruits and vegetables, take potassium supplement  as prescribed, and follow up with primary care doctor.    Follow up with cardiologist in the next 1-2 weeks - call office Monday to arrange appointment.   Return to ER right away if worse, new symptoms, persistent fast heart beat, chest pain, trouble breathing, weak/fainting, or other concern.

## 2019-02-03 NOTE — ED Provider Notes (Signed)
Dylan Vaughn Provider Note   CSN: [id number] Arrival date & time: 02/03/19  [id number]     History Chief Complaint  Patient presents with  . Palpitations    Dylan Vaughn is a 48 y.o. male.  Patient present with feeling as if heart is beating irregular or fast this AM. Symptoms acute onset, moderate, persistent. Denies hx same. No hx afib, svt or other dysrhythmia. No hx cad - states 2 prior stress tests normal. +fam hx cad. Moderate etoh use last night, but denies daily and/or heavy use. No recent chest pain or discomfort. No exertional cp or discomfort. No recent unusual doe or sob. No cough or sore throat. On fever or chills. No recent change in meds. Has not yet had bp med today.   The history is provided by the patient and the spouse.  Chest Pain Associated symptoms: palpitations   Associated symptoms: no abdominal pain, no back pain, no cough, no fever, no numbness, no shortness of breath, no vomiting and no weakness        Past Medical History:  Diagnosis Date  . Blood transfusion without reported diagnosis    2000 with colectomy   . Diverticular disease 04/25/2018  . Diverticulitis of colon with bleeding 2000  . Essential hypertension 04/25/2018  . High blood pressure   . High cholesterol   . History of thyroid disorder 04/25/2018   S/p biopsy WNL  . Mixed hyperlipidemia 04/25/2018    Patient Active Problem List   Diagnosis Date Noted  . Hypertriglyceridemia 08/21/2018  . Impaired glucose tolerance 05/22/2018  . Elevated hemoglobin A1c measurement 04/30/2018  . Essential hypertension 04/25/2018  . Mixed hyperlipidemia 04/25/2018  . Diverticular disease 04/25/2018  . History of thyroid disorder 04/25/2018    Past Surgical History:  Procedure Laterality Date  . COLECTOMY  2000   Ruptured diverticulum- ascending and transverse colon removed   . COLONOSCOPY  2000   ruptured diverticulum   . EYE SURGERY     1978/1988  . INGUINAL HERNIA  REPAIR  1976  . POSTERIOR CRUCIATE LIGAMENT RECONSTRUCTION  1991  . UPPER GASTROINTESTINAL ENDOSCOPY     2000       Family History  Problem Relation Age of Onset  . High blood pressure Father   . Heart attack Father   . Colon polyps Sister   . Colon cancer Neg Hx   . Esophageal cancer Neg Hx   . Rectal cancer Neg Hx   . Stomach cancer Neg Hx     Social History   Tobacco Use  . Smoking status: Never Smoker  . Smokeless tobacco: Never Used  Substance Use Topics  . Alcohol use: Yes    Alcohol/week: 6.0 standard drinks    Types: 6 Standard drinks or equivalent per week    Comment: socially   . Drug use: Never    Home Medications Prior to Admission medications   Medication Sig Start Date End Date Taking? Authorizing Provider  lisinopril-hydrochlorothiazide (PRINZIDE,ZESTORETIC) 20-25 MG tablet Take 1 tablet by mouth daily. 04/25/18  Yes Emeterio Reeve, DO  metoprolol succinate (TOPROL-XL) 25 MG 24 hr tablet Take 1 tablet (25 mg total) by mouth daily. 04/25/18  Yes Emeterio Reeve, DO  niacin (NIASPAN) 1000 MG CR tablet Take 1 tablet (1,000 mg total) by mouth at bedtime. 04/25/18  Yes Emeterio Reeve, DO  rosuvastatin (CRESTOR) 20 MG tablet Take 1 tablet (20 mg total) by mouth at bedtime. 04/25/18  Yes Emeterio Reeve, DO  Icosapent  Ethyl 1 g CAPS Take 1 capsule (1 g total) by mouth 2 (two) times daily. Patient not taking: Reported on 01/22/2019 12/26/18   Emeterio Reeve, DO    Allergies    Belladonna alkaloids  Review of Systems   Review of Systems  Constitutional: Negative for fever.  HENT: Negative for sore throat.   Eyes: Negative for redness.  Respiratory: Negative for cough and shortness of breath.   Cardiovascular: Positive for palpitations.  Gastrointestinal: Negative for abdominal pain, diarrhea and vomiting.  Genitourinary: Negative for flank pain.  Musculoskeletal: Negative for back pain and neck pain.  Skin: Negative for rash.  Neurological:  Negative for syncope, weakness and numbness.  Hematological: Does not bruise/bleed easily.  Psychiatric/Behavioral: Negative for confusion.    Physical Exam Updated Vital Signs BP (!) 156/120   Pulse 95   Temp 98.7 F (37.1 C) (Oral)   Resp (!) 21   SpO2 100%   Physical Exam Vitals and nursing note reviewed.  Constitutional:      Appearance: Normal appearance. He is well-developed.  HENT:     Head: Atraumatic.     Nose: Nose normal.     Mouth/Throat:     Mouth: Mucous membranes are moist.     Pharynx: Oropharynx is clear.  Eyes:     General: No scleral icterus.    Conjunctiva/sclera: Conjunctivae normal.  Neck:     Trachea: No tracheal deviation.     Comments: Thyroid not grossly enlarged or tender.  Cardiovascular:     Rate and Rhythm: Tachycardia present. Rhythm irregular.     Pulses: Normal pulses.     Heart sounds: Normal heart sounds. No murmur. No friction rub. No gallop.   Pulmonary:     Effort: Pulmonary effort is normal. No accessory muscle usage or respiratory distress.     Breath sounds: Normal breath sounds.  Abdominal:     General: Bowel sounds are normal. There is no distension.     Palpations: Abdomen is soft.     Tenderness: There is no abdominal tenderness.  Genitourinary:    Comments: No cva tenderness. Musculoskeletal:        General: No swelling or tenderness.     Cervical back: Normal range of motion and neck supple. No rigidity.     Right lower leg: No edema.     Left lower leg: No edema.  Skin:    General: Skin is warm and dry.     Findings: No rash.  Neurological:     Mental Status: He is alert.     Comments: Alert, speech clear.   Psychiatric:        Mood and Affect: Mood normal.     ED Results / Procedures / Treatments   Labs (all labs ordered are listed, but only abnormal results are displayed) Results for orders placed or performed during the hospital encounter of 02/03/19  CBC  Result Value Ref Range   WBC 14.5 (H) 4.0 -  10.5 K/uL   RBC 5.24 4.22 - 5.81 MIL/uL   Hemoglobin 15.6 13.0 - 17.0 g/dL   HCT 46.3 39.0 - 52.0 %   MCV 88.4 80.0 - 100.0 fL   MCH 29.8 26.0 - 34.0 pg   MCHC 33.7 30.0 - 36.0 g/dL   RDW 12.2 11.5 - 15.5 %   Platelets 253 150 - 400 K/uL   nRBC 0.0 0.0 - 0.2 %  CMET  Result Value Ref Range   Sodium 138 135 - 145 mmol/L  Potassium 3.4 (L) 3.5 - 5.1 mmol/L   Chloride 101 98 - 111 mmol/L   CO2 27 22 - 32 mmol/L   Glucose, Bld 147 (H) 70 - 99 mg/dL   BUN 7 6 - 20 mg/dL   Creatinine, Ser 0.82 0.61 - 1.24 mg/dL   Calcium 8.8 (L) 8.9 - 10.3 mg/dL   Total Protein 7.5 6.5 - 8.1 g/dL   Albumin 4.1 3.5 - 5.0 g/dL   AST 23 15 - 41 U/L   ALT 31 0 - 44 U/L   Alkaline Phosphatase 59 38 - 126 U/L   Total Bilirubin 0.9 0.3 - 1.2 mg/dL   GFR calc non Af Amer >60 >60 mL/min   GFR calc Af Amer >60 >60 mL/min   Anion gap 10 5 - 15    EKG EKG Interpretation  Date/Time:  Sunday February 03 2019 10:03:21 EST Ventricular Rate:  158 PR Interval:    QRS Duration: 90 QT Interval:  294 QTC Calculation: 477 R Axis:   37 Text Interpretation: Atrial fibrillation No previous tracing Confirmed by Lajean Saver [phone]) on 02/03/2019 10:05:29 AM   Radiology No results found.  Procedures Procedures (including critical care time)  Medications Ordered in ED Medications  diltiazem (CARDIZEM) 1 mg/mL load via infusion 20 mg (has no administration in time range)    And  diltiazem (CARDIZEM) 100 mg in dextrose 5 % 100 mL (1 mg/mL) infusion (has no administration in time range)  diltiazem (CARDIZEM) 100 MG injection (has no administration in time range)  diltiazem (CARDIZEM) 25 MG/5ML injection (has no administration in time range)  potassium chloride SA (KLOR-CON) CR tablet 40 mEq (has no administration in time range)  metoprolol tartrate (LOPRESSOR) tablet 25 mg (has no administration in time range)  sodium chloride 0.9 % bolus 1,000 mL (1,000 mLs Intravenous New Bag/Given 02/03/19 1036)    ED  Course  I have reviewed the triage vital signs and the nursing notes.  Pertinent labs & imaging results that were available during my care of the patient were reviewed by me and considered in my medical decision making (see chart for details).    MDM Rules/Calculators/A&P   Iv ns. Continuous pulse ox and monitor. o2 [place]. Stat labs.   cardizem and ivf ordered.  Cha2ds2vasc score 1.   As patient up to use urinal - converted to sinus rhythm.   Patient takes bp med, including metoprolol - hasnt had it today. Metoprolol po.  Labs reviewed/interpreted by me - k sl low. kcl po.  Reviewed nursing notes and prior charts for additional history.   Patient remains in sinus rhythm and asymptomatic on recheck.   Pt currently appears stable for d/c.  Rec close outpt cardiology follow up.  Return precautions provided.     Final Clinical Impression(s) / ED Diagnoses Final diagnoses:  None    Rx / DC Orders ED Discharge Orders    None       Lajean Saver, MD 02/03/19 1108

## 2019-02-03 NOTE — ED Triage Notes (Signed)
Pt here with chest tightness and discomfort since this morning while walking the dog. States heart feels "irregular" and was pale and diaphoretic when it occurred.

## 2019-02-05 ENCOUNTER — Telehealth: Payer: Self-pay | Admitting: *Deleted

## 2019-02-05 NOTE — Telephone Encounter (Signed)
1. Have you developed a fever since your procedure? no  2.   Have you had an respiratory symptoms (SOB or cough) since your procedure? no  3.   Have you tested positive for COVID 19 since your procedure no  4.   Have you had any family members/close contacts diagnosed with the COVID 19 since your procedure?  no   If yes to any of these questions please route to Joylene John, RN and Alphonsa Gin, Therapist, sports.  Follow up Call-  Call back number 02/01/2019  Post procedure Call Back phone  # [phone]  Permission to leave phone message Yes     Patient questions:  Do you have a fever, pain , or abdominal swelling? No. Pain Score  0 *  Have you tolerated food without any problems? Yes.    Have you been able to return to your normal activities? Yes.    Do you have any questions about your discharge instructions: Diet   No. Medications  No. Follow up visit  No.  Do you have questions or concerns about your Care? No.  Actions: * If pain score is 4 or above: No action needed, pain <4.

## 2019-02-07 ENCOUNTER — Encounter: Payer: Self-pay | Admitting: Gastroenterology

## 2019-02-08 ENCOUNTER — Encounter: Payer: Self-pay | Admitting: Osteopathic Medicine

## 2019-02-08 MED ORDER — OMEGA-3-ACID ETHYL ESTERS 1 G PO CAPS: 2.0000 g | ORAL_CAPSULE | Freq: Two times a day (BID) | ORAL | 3 refills | Status: DC

## 2019-02-18 ENCOUNTER — Other Ambulatory Visit: Payer: Self-pay

## 2019-02-18 ENCOUNTER — Ambulatory Visit: Payer: BC Managed Care – PPO | Admitting: Cardiology

## 2019-02-18 ENCOUNTER — Encounter: Payer: Self-pay | Admitting: Cardiology

## 2019-02-18 VITALS — BP 148/78 | HR 84 | Ht 78.0 in | Wt 309.8 lb

## 2019-02-18 DIAGNOSIS — I480 Paroxysmal atrial fibrillation: Secondary | ICD-10-CM

## 2019-02-18 DIAGNOSIS — I10 Essential (primary) hypertension: Secondary | ICD-10-CM

## 2019-02-18 DIAGNOSIS — G4733 Obstructive sleep apnea (adult) (pediatric): Secondary | ICD-10-CM

## 2019-02-18 HISTORY — DX: Paroxysmal atrial fibrillation: I48.0

## 2019-02-18 HISTORY — DX: Obstructive sleep apnea (adult) (pediatric): G47.33

## 2019-02-18 MED ORDER — METOPROLOL SUCCINATE ER 50 MG PO TB24: 50.0000 mg | ORAL_TABLET | Freq: Every day | ORAL | 3 refills | Status: DC

## 2019-02-18 NOTE — Progress Notes (Signed)
Cardiology Consultation:    Date:  02/18/2019   ID:  Dylan Vaughn, DOB 1971/02/14, MRN [id number]  PCP:  Emeterio Reeve, DO  Cardiologist:  Jenne Campus, MD   Referring MD: Emeterio Reeve, DO   Chief Complaint  Patient presents with  . Follow-up    ER FU AFIB     History of Present Illness:    Dylan Vaughn is a 48 y.o. male who is being seen today for the evaluation of atrial fibrillation at the request of Emeterio Reeve, DO.  He is a gentleman with past medical history significant for hypertension, also drinks alcohol, morbidly obese, snores a lot.  Recently he ended going to the emergency room because of palpitations.  He was find to be in atrial fibrillation with fast ventricular rate.  He was given calcium channel blocker and converted spontaneously to sinus rhythm.  Since that time he reports one-time episode of atrial fibrillation with his apple watch.  He feels his heart speeding up and feeling weak when he has those episodes but no chest pain no shortness of breath no dizziness no feeling like he is going to pass out.  Overall he never had a problem before presentation to the emergency room.  He try to be active however does not exercise and exercise routine.  He does have history of hypertension, questionable diabetes, morbid obesity, his wife tells him that he snores a lot and she actually wanted him to have a sleep study which I think is an excellent idea.  He drinks about 3 times a week he drinks beer wine or liquor.  He does not think it is a problem.  He does have family history of premature coronary artery disease.  Multiple family members in the probably significant heart problem before age of 70.  Past Medical History:  Diagnosis Date  . Blood transfusion without reported diagnosis    2000 with colectomy   . Diverticular disease 04/25/2018  . Diverticulitis of colon with bleeding 2000  . Essential hypertension 04/25/2018  . High blood pressure   . High  cholesterol   . History of thyroid disorder 04/25/2018   S/p biopsy WNL  . Mixed hyperlipidemia 04/25/2018    Past Surgical History:  Procedure Laterality Date  . COLECTOMY  2000   Ruptured diverticulum- ascending and transverse colon removed   . COLONOSCOPY  2000   ruptured diverticulum   . EYE SURGERY     1978/1988  . INGUINAL HERNIA REPAIR  1976  . POSTERIOR CRUCIATE LIGAMENT RECONSTRUCTION  1991  . UPPER GASTROINTESTINAL ENDOSCOPY     2000    Current Medications: Current Meds  Medication Sig  . lisinopril-hydrochlorothiazide (PRINZIDE,ZESTORETIC) 20-25 MG tablet Take 1 tablet by mouth daily.  . niacin (NIASPAN) 1000 MG CR tablet Take 1 tablet (1,000 mg total) by mouth at bedtime.  Marland Kitchen omega-3 acid ethyl esters (LOVAZA) 1 g capsule Take 2 capsules (2 g total) by mouth 2 (two) times daily.  . potassium chloride SA (KLOR-CON) 20 MEQ tablet Take 1 tablet (20 mEq total) by mouth daily.  . rosuvastatin (CRESTOR) 20 MG tablet Take 1 tablet (20 mg total) by mouth at bedtime.  . [DISCONTINUED] metoprolol succinate (TOPROL-XL) 25 MG 24 hr tablet Take 1 tablet (25 mg total) by mouth daily.     Allergies:   Belladonna alkaloids   Social History   Socioeconomic History  . Marital status: Married    Spouse name: Not on file  . Number of children: Not  on file  . Years of education: Not on file  . Highest education level: Not on file  Occupational History    Employer: [employer]  Tobacco Use  . Smoking status: Never Smoker  . Smokeless tobacco: Never Used  Substance and Sexual Activity  . Alcohol use: Yes    Alcohol/week: 6.0 standard drinks    Types: 6 Standard drinks or equivalent per week    Comment: socially   . Drug use: Never  . Sexual activity: Yes    Birth control/protection: Other-see comments    Comment: Vasectomy  Other Topics Concern  . Not on file  Social History Narrative  . Not on file   Social Determinants of Health   Financial Resource Strain:   .  Difficulty of Paying Living Expenses: Not on file  Food Insecurity:   . Worried About Charity fundraiser in the Last Year: Not on file  . Ran Out of Food in the Last Year: Not on file  Transportation Needs:   . Lack of Transportation (Medical): Not on file  . Lack of Transportation (Non-Medical): Not on file  Physical Activity:   . Days of Exercise per Week: Not on file  . Minutes of Exercise per Session: Not on file  Stress:   . Feeling of Stress : Not on file  Social Connections:   . Frequency of Communication with Friends and Family: Not on file  . Frequency of Social Gatherings with Friends and Family: Not on file  . Attends Religious Services: Not on file  . Active Member of Clubs or Organizations: Not on file  . Attends Archivist Meetings: Not on file  . Marital Status: Not on file     Family History: The patient's family history includes Colon polyps in his sister; Heart attack in his father; High blood pressure in his father. There is no history of Colon cancer, Esophageal cancer, Rectal cancer, or Stomach cancer. ROS:   Please see the history of present illness.    All 14 point review of systems negative except as described per history of present illness.  EKGs/Labs/Other Studies Reviewed:    The following studies were reviewed today: EKG from the emergency room show atrial fibrillation fast ventricular rate.  EKG after that showed sinus rhythm normal P interval normal QS complex duration morphology nonspecific ST segment changes    Recent Labs: 04/27/2018: TSH 0.54 02/03/2019: ALT 31; BUN 7; Creatinine, Ser 0.82; Hemoglobin 15.6; Platelets 253; Potassium 3.4; Sodium 138  Recent Lipid Panel    Component Value Date/Time   CHOL 128 12/24/2018 0820   TRIG 223 (H) 12/24/2018 0820   HDL 38 (L) 12/24/2018 0820   CHOLHDL 3.4 12/24/2018 0820   LDLCALC 62 12/24/2018 0820    Physical Exam:    VS:  BP (!) 148/78   Pulse 84   Ht 6\' 6"  (1.981 m)   Wt (!) 309  lb 12.8 oz (140.5 kg)   SpO2 99%   BMI 35.80 kg/m     Wt Readings from Last 3 Encounters:  02/18/19 (!) 309 lb 12.8 oz (140.5 kg)  02/01/19 (!) 305 lb (138.3 kg)  01/22/19 (!) 312 lb (141.5 kg)     GEN:  Well nourished, well developed in no acute distress HEENT: Normal NECK: No JVD; No carotid bruits LYMPHATICS: No lymphadenopathy CARDIAC: RRR, no murmurs, no rubs, no gallops RESPIRATORY:  Clear to auscultation without rales, wheezing or rhonchi  ABDOMEN: Soft, non-tender, non-distended MUSCULOSKELETAL:  No edema; No deformity  SKIN: Warm and dry NEUROLOGIC:  Alert and oriented x 3 PSYCHIATRIC:  Normal affect   ASSESSMENT:    1. Essential hypertension   2. Obstructive sleep apnea   3. Paroxysmal atrial fibrillation (HCC)    PLAN:    In order of problems listed above:  1. Essential hypertension.  Blood pressure slightly elevated.  I asked him to check blood pressure on the regular basis at home. 2. Paroxysmal atrial fibrillation his chads 2 vascular equals 1.  Obviously we will watch his hemoglobin A1c that will be abnormal then we talking about chads 2 vascular equals 2.  For now asking to start taking 1 baby aspirin every single day.  I will also increase dose of his metoprolol he takes metoprolol succinate 25 mg we will go to 50 mg once a day.  He will be scheduled to have an echocardiogram as well as sleep study.  I strongly suspect he got significant sleep apnea and management of this problem will help in preventing him from having episode of atrial fibrillation.  At this stage we did not switch to pointed antiarrhythmic is needed.  Therefore, will continue simply control his rate as well as continue with aspirin.  In terms of etiology of atrial fibrillation obviously sleep apnea is the first suspect second is the fact that he drinks alcohol and I strongly recommend to quit that.  We did talk also about exercises on the regular basis and weight management which will help in  preventing him from having episode of atrial fibrillation. 3. Potential coronary artery disease no typical symptoms but multiple family members premature coronary artery disease I asked her to have a calcium score for prognostic reasons that will help [place] to decide about his management. 4. Hypertriglyceridemia.  He takes Vascepa as well as Niaspan.  We will continue with this for now but will get copy of fasting lipid profile from his primary care physician.  Overall multiple changes in his life need to happen in order to prevent him from having recurrences of atrial fibrillation.  We discussed the process of atrial fibrillation all risks of this problem phenomenon.   Medication Adjustments/Labs and Tests Ordered: Current medicines are reviewed at length with the patient today.  Concerns regarding medicines are outlined above.  Orders Placed This Encounter  Procedures  . CT CARDIAC SCORING aka Calcium Score  . TSH  . Ambulatory referral to Sleep Studies  . ECHOCARDIOGRAM COMPLETE   Meds ordered this encounter  Medications  . metoprolol succinate (TOPROL-XL) 50 MG 24 hr tablet    Sig: Take 1 tablet (50 mg total) by mouth daily. Take with or immediately following a meal.    Dispense:  90 tablet    Refill:  3    Signed, Park Liter, MD, [facility]. 02/18/2019 3:55 PM    [place]

## 2019-02-18 NOTE — Patient Instructions (Signed)
Medication Instructions:  START metoprolol succinate 50 mg by mouth daily. START aspirin 81 mg by mouth daily.  *If you need a refill on your cardiac medications before your next appointment, please call your pharmacy*  Lab Work: TSH today If you have labs (blood work) drawn today and your tests are completely normal, you will receive your results only by: Marland Kitchen MyChart Message (if you have MyChart) OR . A paper copy in the mail If you have any lab test that is abnormal or we need to change your treatment, we will call you to review the results.  Testing/Procedures: Your physician has requested that you have an echocardiogram. Echocardiography is a painless test that uses sound waves to create images of your heart. It provides your doctor with information about the size and shape of your heart and how well your heart's chambers and valves are working. This procedure takes approximately one hour. There are no restrictions for this procedure.  Non-Cardiac CT scanning, (CAT scanning), is a noninvasive, special x-ray that produces cross-sectional images of the body using x-rays and a computer. CT scans help physicians diagnose and treat medical conditions. For some CT exams, a contrast material is used to enhance visibility in the area of the body being studied. CT scans provide greater clarity and reveal more details than regular x-ray exams.   Follow-Up: At [facility], you and your health needs are our priority.  As part of our continuing mission to provide you with exceptional heart care, we have created designated Provider Care Teams.  These Care Teams include your primary Cardiologist (physician) and Advanced Practice Providers (APPs -  Physician Assistants and Nurse Practitioners) who all work together to provide you with the care you need, when you need it.  Your next appointment:   1 month(s)  The format for your next appointment:   In Person  Provider:   Jenne Campus, MD  Other  Instructions Call to set up apt for a SLEEP STUDY.

## 2019-02-19 LAB — TSH: TSH: 0.405 u[IU]/mL — ABNORMAL LOW (ref 0.450–4.500)

## 2019-02-20 ENCOUNTER — Other Ambulatory Visit: Payer: Self-pay

## 2019-02-20 ENCOUNTER — Ambulatory Visit (HOSPITAL_BASED_OUTPATIENT_CLINIC_OR_DEPARTMENT_OTHER)
Admission: RE | Admit: 2019-02-20 | Discharge: 2019-02-20 | Disposition: A | Discharge status: Home / Self Care | Payer: BC Managed Care – PPO | Source: Ambulatory Visit | Attending: Cardiology | Admitting: Cardiology

## 2019-02-20 DIAGNOSIS — I480 Paroxysmal atrial fibrillation: Secondary | ICD-10-CM | POA: Insufficient documentation

## 2019-02-20 DIAGNOSIS — I10 Essential (primary) hypertension: Secondary | ICD-10-CM | POA: Diagnosis not present

## 2019-02-20 NOTE — Progress Notes (Signed)
  Echocardiogram 2D Echocardiogram has been performed.  Dylan Vaughn 02/20/2019, 12:09 PM

## 2019-02-26 ENCOUNTER — Telehealth: Payer: Self-pay | Admitting: Emergency Medicine

## 2019-02-26 NOTE — Telephone Encounter (Signed)
Left results on voicemail per dpr. Advised patient to call with any questions.

## 2019-03-07 ENCOUNTER — Telehealth: Payer: Self-pay

## 2019-03-07 NOTE — Telephone Encounter (Signed)
Hi Stacy,   We had this patient reach out to [place] about getting his CT cardiac scoring scheduled. This order was placed when Dr. Marthann Schiller nurse was out of the office so we apologize for the delayed response to you about getting him set up. Thanks so much.

## 2019-03-13 ENCOUNTER — Encounter: Payer: Self-pay | Admitting: Osteopathic Medicine

## 2019-03-14 ENCOUNTER — Telehealth: Payer: Self-pay | Admitting: Osteopathic Medicine

## 2019-03-14 MED ORDER — OMEGA-3-ACID ETHYL ESTERS 1 G PO CAPS: 2.0000 g | ORAL_CAPSULE | Freq: Two times a day (BID) | ORAL | 3 refills | Status: DC

## 2019-03-14 NOTE — Telephone Encounter (Signed)
Received fax from [employer] that [employer] requires a PA. Information has been sent to the insurance company. Awaiting determination.

## 2019-03-20 ENCOUNTER — Ambulatory Visit: Payer: BC Managed Care – PPO | Admitting: Cardiology

## 2019-03-22 NOTE — Telephone Encounter (Signed)
Received a fax that [employer] was denied. Patient reports he had bad burps with the medication and does not want to take this at this time. He is going to try fish oil. No other questions.

## 2019-03-28 NOTE — Telephone Encounter (Signed)
Approvedtoday ([employer]as[employer]) CaseId:[id number];Status:Approved;Review Type:Prior Auth;Coverage Start Date:02/26/2019;Coverage End Date:03/27/2022; Pharmacy aware.

## 2019-04-04 ENCOUNTER — Other Ambulatory Visit: Payer: Self-pay

## 2019-04-04 ENCOUNTER — Ambulatory Visit (INDEPENDENT_AMBULATORY_CARE_PROVIDER_SITE_OTHER)
Admission: RE | Admit: 2019-04-04 | Discharge: 2019-04-04 | Disposition: A | Discharge status: Home / Self Care | Payer: Self-pay | Source: Ambulatory Visit | Attending: Cardiology | Admitting: Cardiology

## 2019-04-04 DIAGNOSIS — I10 Essential (primary) hypertension: Secondary | ICD-10-CM

## 2019-04-04 DIAGNOSIS — I480 Paroxysmal atrial fibrillation: Secondary | ICD-10-CM

## 2019-04-15 ENCOUNTER — Telehealth: Payer: Self-pay | Admitting: Cardiology

## 2019-04-15 NOTE — Telephone Encounter (Signed)
Patient states he received a call from 04/12/19. Not sure what it was in regards to, did not see any notes.

## 2019-04-15 NOTE — Telephone Encounter (Signed)
Called patient back. Informed him the only thing I see was his results from his ct calcium score, I went over this with him. Then he asked about a sleep study. It doesn't look like this was ever scheduled for the patient I will confirm  if Dr. Agustin Cree still wants one and follow up with patient.

## 2019-04-16 ENCOUNTER — Ambulatory Visit (INDEPENDENT_AMBULATORY_CARE_PROVIDER_SITE_OTHER): Payer: BC Managed Care – PPO | Admitting: Cardiology

## 2019-04-16 ENCOUNTER — Other Ambulatory Visit: Payer: Self-pay

## 2019-04-16 ENCOUNTER — Encounter: Payer: Self-pay | Admitting: Cardiology

## 2019-04-16 VITALS — BP 124/82 | HR 85 | Ht 78.0 in | Wt 318.0 lb

## 2019-04-16 DIAGNOSIS — E782 Mixed hyperlipidemia: Secondary | ICD-10-CM

## 2019-04-16 DIAGNOSIS — I10 Essential (primary) hypertension: Secondary | ICD-10-CM

## 2019-04-16 DIAGNOSIS — R400 Somnolence: Secondary | ICD-10-CM

## 2019-04-16 DIAGNOSIS — I480 Paroxysmal atrial fibrillation: Secondary | ICD-10-CM

## 2019-04-16 DIAGNOSIS — G4733 Obstructive sleep apnea (adult) (pediatric): Secondary | ICD-10-CM | POA: Diagnosis not present

## 2019-04-16 NOTE — Progress Notes (Signed)
Cardiology Office Note:    Date:  04/16/2019   ID:  Dylan Vaughn, DOB [birth year], MRN [id number]  PCP:  Dylan Reeve, DO  Cardiologist:  Dylan Campus, MD    Referring MD: Dylan Reeve, DO   No chief complaint on file. Doing well  History of Present Illness:    Dylan Vaughn is a 49 y.o. male initially referred to me because of paroxysmal atrial fibrillation.  Quite extensive evaluation has been done which include an echocardiogram showing preserved left ventricle ejection fraction, normal left atrial size.  We are trying to figure out what the etiology of his atrial fibrillation is.  His thyroid is normal, but he does have snoring.  Awaiting sleep study, also drinking appears to be a problem.  He described 1 episode of atrial fibrillation since have seen him last time and it happened after he drank some alcohol.  We talked in length about this and of course advised him to cut down alcohol.  On top of that he noted some changes in the skin in his left foot.  It looks like almost like cholesterol emboli that sometimes we see after cardiac catheterization there is a small painful lesion getting better overall it started few days ago only left lower extremity.  There was no manipulation in his arterial trees.  Past Medical History:  Diagnosis Date  . Blood transfusion without reported diagnosis    2000 with colectomy   . Diverticular disease 04/25/2018  . Diverticulitis of colon with bleeding 2000  . Essential hypertension 04/25/2018  . High blood pressure   . High cholesterol   . History of thyroid disorder 04/25/2018   S/p biopsy WNL  . Mixed hyperlipidemia 04/25/2018    Past Surgical History:  Procedure Laterality Date  . COLECTOMY  2000   Ruptured diverticulum- ascending and transverse colon removed   . COLONOSCOPY  2000   ruptured diverticulum   . EYE SURGERY     1978/1988  . INGUINAL HERNIA REPAIR  1976  . POSTERIOR CRUCIATE LIGAMENT RECONSTRUCTION  1991  . UPPER  GASTROINTESTINAL ENDOSCOPY     2000    Current Medications: Current Meds  Medication Sig  . lisinopril-hydrochlorothiazide (PRINZIDE,ZESTORETIC) 20-25 MG tablet Take 1 tablet by mouth daily.  . metoprolol succinate (TOPROL-XL) 50 MG 24 hr tablet Take 1 tablet (50 mg total) by mouth daily. Take with or immediately following a meal.  . niacin (NIASPAN) 1000 MG CR tablet Take 1 tablet (1,000 mg total) by mouth at bedtime.  Marland Kitchen omega-3 acid ethyl esters (LOVAZA) 1 g capsule Take 2 capsules (2 g total) by mouth 2 (two) times daily.  . potassium chloride SA (KLOR-CON) 20 MEQ tablet Take 1 tablet (20 mEq total) by mouth daily.  . rosuvastatin (CRESTOR) 20 MG tablet Take 1 tablet (20 mg total) by mouth at bedtime.     Allergies:   Belladonna alkaloids   Social History   Socioeconomic History  . Marital status: Married    Spouse name: Not on file  . Number of children: Not on file  . Years of education: Not on file  . Highest education level: Not on file  Occupational History    Employer: [employer] NA  Tobacco Use  . Smoking status: Never Smoker  . Smokeless tobacco: Never Used  Substance and Sexual Activity  . Alcohol use: Yes    Alcohol/week: 6.0 standard drinks    Types: 6 Standard drinks or equivalent per week    Comment: socially   .  Drug use: Never  . Sexual activity: Yes    Birth control/protection: Other-see comments    Comment: Vasectomy  Other Topics Concern  . Not on file  Social History Narrative  . Not on file   Social Determinants of Health   Financial Resource Strain:   . Difficulty of Paying Living Expenses: Not on file  Food Insecurity:   . Worried About Charity fundraiser in the Last Year: Not on file  . Ran Out of Food in the Last Year: Not on file  Transportation Needs:   . Lack of Transportation (Medical): Not on file  . Lack of Transportation (Non-Medical): Not on file  Physical Activity:   . Days of Exercise per Week: Not on file  . Minutes of  Exercise per Session: Not on file  Stress:   . Feeling of Stress : Not on file  Social Connections:   . Frequency of Communication with Friends and Family: Not on file  . Frequency of Social Gatherings with Friends and Family: Not on file  . Attends Religious Services: Not on file  . Active Member of Clubs or Organizations: Not on file  . Attends Archivist Meetings: Not on file  . Marital Status: Not on file     Family History: The patient's family history includes Colon polyps in his sister; Heart attack in his father; High blood pressure in his father. There is no history of Colon cancer, Esophageal cancer, Rectal cancer, or Stomach cancer. ROS:   Please see the history of present illness.    All 14 point review of systems negative except as described per history of present illness  EKGs/Labs/Other Studies Reviewed:      Recent Labs: 02/03/2019: ALT 31; BUN 7; Creatinine, Ser 0.82; Hemoglobin 15.6; Platelets 253; Potassium 3.4; Sodium 138 02/18/2019: TSH 0.405  Recent Lipid Panel    Component Value Date/Time   CHOL 128 12/24/2018 0820   TRIG 223 (H) 12/24/2018 0820   HDL 38 (L) 12/24/2018 0820   CHOLHDL 3.4 12/24/2018 0820   LDLCALC 62 12/24/2018 0820    Physical Exam:    VS:  BP 124/82   Pulse 85   Ht 6\' 6"  (1.981 m)   Wt (!) 318 lb (144.2 kg)   SpO2 96%   BMI 36.75 kg/m     Wt Readings from Last 3 Encounters:  04/16/19 (!) 318 lb (144.2 kg)  02/18/19 (!) 309 lb 12.8 oz (140.5 kg)  02/01/19 (!) 305 lb (138.3 kg)     GEN:  Well nourished, well developed in no acute distress HEENT: Normal NECK: No JVD; No carotid bruits LYMPHATICS: No lymphadenopathy CARDIAC: RRR, no murmurs, no rubs, no gallops RESPIRATORY:  Clear to auscultation without rales, wheezing or rhonchi  ABDOMEN: Soft, non-tender, non-distended MUSCULOSKELETAL:  No edema; No deformity  SKIN: Warm and dry LOWER EXTREMITIES: no swelling NEUROLOGIC:  Alert and oriented x  3 PSYCHIATRIC:  Normal affect   ASSESSMENT:    1. Paroxysmal atrial fibrillation (HCC)   2. Essential hypertension   3. Obstructive sleep apnea   4. Mixed hyperlipidemia    PLAN:    In order of problems listed above:  1. Paroxysmal atrial fibrillation his chads 2 vascular equals 1.  He is on aspirin which I will continue.  He does have apple watch and will be able to monitor frequency of his atrial fibrillation.  He was advised to cut down alcohol.  Awaiting sleep study.  Echocardiogram showed preserved left ventricle  ejection fraction. 2. Essential hypertension blood pressure appears to be well controlled continue present management. 3. Suspected obstructive sleep apnea.  Awaiting sleep study. 4. Mixed dyslipidemia.  I did do his calcium score which came 245.  It is elevated obviously.  We need to be much more aggressive in management of his risk factors.  We talk about lifestyle modifications including exercises on the regular basis good diet.  I will check his fasting lipid profile make sure his LDL is appropriately managed.  He is already on aspirin which I will continue. 5. Skin changes in the left foot on his big toe.  Again that look almost like a cholesterol emboli that we see after cardiac catheterization.  Is a tiny painful rectal lesions.  Also getting better overall.  I will schedule him to have aortic ultrasounds to look at the degree of atherosclerosis in his aorta as well as iliac vessels.   Medication Adjustments/Labs and Tests Ordered: Current medicines are reviewed at length with the patient today.  Concerns regarding medicines are outlined above.  No orders of the defined types were placed in this encounter.  Medication changes: No orders of the defined types were placed in this encounter.   Signed, Park Liter, MD, [facility] 04/16/2019 8:30 AM    [place]

## 2019-04-16 NOTE — Addendum Note (Signed)
Addended by: Ashok Norris on: 04/16/2019 08:43 AM   Modules accepted: Orders

## 2019-04-16 NOTE — Patient Instructions (Signed)
Medication Instructions:  Your physician recommends that you continue on your current medications as directed. Please refer to the Current Medication list given to you today.  *If you need a refill on your cardiac medications before your next appointment, please call your pharmacy*  Lab Work: Your physician recommends that you return for lab work today: lipid   If you have labs (blood work) drawn today and your tests are completely normal, you will receive your results only by: Marland Kitchen MyChart Message (if you have MyChart) OR . A paper copy in the mail If you have any lab test that is abnormal or we need to change your treatment, we will call you to review the results.  Testing/Procedures: You will have a abdominal aortic ultrasound.   Your physician has recommended that you have a sleep study. This test records several body functions during sleep, including: brain activity, eye movement, oxygen and carbon dioxide blood levels, heart rate and rhythm, breathing rate and rhythm, the flow of air through your mouth and nose, snoring, body muscle movements, and chest and belly movement.    Follow-Up: At [facility], you and your health needs are our priority.  As part of our continuing mission to provide you with exceptional heart care, we have created designated Provider Care Teams.  These Care Teams include your primary Cardiologist (physician) and Advanced Practice Providers (APPs -  Physician Assistants and Nurse Practitioners) who all work together to provide you with the care you need, when you need it.  Your next appointment:   3 month(s)  The format for your next appointment:   In Person  Provider:   You may see Dr. Agustin Cree or the following Advanced Practice Provider on your designated Care Team:    Laurann Montana, FNP   Other Instructions   Sleep Studies A sleep study (polysomnogram) is a series of tests done while you are sleeping. A sleep study records your brain waves,  heart rate, breathing rate, oxygen level, and eye and leg movements. A sleep study helps your health care provider:  See how well you sleep.  Diagnose a sleep disorder.  Determine how severe your sleep disorder is.  Create a plan to treat your sleep disorder. Your health care provider may recommend a sleep study if you:  Feel sleepy on most days.  Snore loudly while sleeping.  Have unusual behaviors while you sleep, such as walking.  Have brief periods in which you stop breathing during sleep (sleepapnea).  Fall asleep suddenly during the day (narcolepsy).  Have trouble falling asleep or staying asleep (insomnia).  Feel like you need to move your legs when trying to fall asleep (restless legs syndrome).  Move your legs by flexing and extending them regularly while asleep (periodic limb movement disorder).  Act out your dreams while you sleep (sleep behavior disorder).  Feel like you cannot move when you first wake up (sleep paralysis). What tests are part of a sleep study? Most sleep studies record the following during sleep:  Brain activity.  Eye movements.  Heart rate and rhythm.  Breathing rate and rhythm.  Blood-oxygen level.  Blood pressure.  Chest and belly movement as you breathe.  Arm and leg movements.  Snoring or other noises.  Body position. Where are sleep studies done? Sleep studies are done at sleep centers. A sleep center may be inside a hospital, office, or clinic. The room where you have the study may look like a hospital room or a hotel room. The health  care providers doing the study may come in and out of the room during the study. Most of the time, they will be in another room monitoring your test as you sleep. How are sleep studies done? Most sleep studies are done during a normal period of time for a full night of sleep. You will arrive at the study center in the evening and go home in the morning. Before the test  Bring your pajamas  and toothbrush with you to the sleep study.  Do not have caffeine on the day of your sleep study.  Do not drink alcohol on the day of your sleep study.  Your health care provider will let you know if you should stop taking any of your regular medicines before the test. During the test      Round, sticky patches with sensors attached to recording wires (electrodes) are placed on your scalp, face, chest, and limbs.  Wires from all the electrodes and sensors run from your bed to a computer. The wires can be taken off and put back on if you need to get out of bed to go to the bathroom.  A sensor is placed over your nose to measure airflow.  A finger clip is put on your finger or ear to measure your blood oxygen level (pulse oximetry).  A belt is placed around your belly and a belt is placed around your chest to measure breathing movements.  If you have signs of the sleep disorder called sleep apnea during your test, you may get a treatment mask to wear for the second half of the night. ? The mask provides positive airway pressure (PAP) to help you breathe better during sleep. This may greatly improve your sleep apnea. ? You will then have all tests done again with the mask in place to see if your measurements and recordings change. After the test  A medical doctor who specializes in sleep will evaluate the results of your sleep study and share them with you and your primary health care provider.  Based on your results, your medical history, and a physical exam, you may be diagnosed with a sleep disorder, such as: ? Sleep apnea. ? Restless legs syndrome. ? Sleep-related behavior disorder. ? Sleep-related movement disorders. ? Sleep-related seizure disorders.  Your health care team will help determine your treatment options based on your diagnosis. This may include: ? Improving your sleep habits (sleep hygiene). ? Wearing a continuous positive airway pressure (CPAP) or bi-level  positive airway pressure (BPAP) mask. ? Wearing an oral device at night to improve breathing and reduce snoring. ? Taking medicines. Follow these instructions at home:  Take over-the-counter and prescription medicines only as told by your health care provider.  If you are instructed to use a CPAP or BPAP mask, make sure you use it nightly as directed.  Make any lifestyle changes that your health care provider recommends.  If you were given a device to open your airway while you sleep, use it only as told by your health care provider.  Do not use any tobacco products, such as cigarettes, chewing tobacco, and e-cigarettes. If you need help quitting, ask your health care provider.  Keep all follow-up visits as told by your health care provider. This is important. Summary  A sleep study (polysomnogram) is a series of tests done while you are sleeping. It shows how well you sleep.  Most sleep studies are done over one full night of sleep. You will arrive at  the study center in the evening and go home in the morning.  If you have signs of the sleep disorder called sleep apnea during your test, you may get a treatment mask to wear for the second half of the night.  A medical doctor who specializes in sleep will evaluate the results of your sleep study and share them with your primary health care provider. This information is not intended to replace advice given to you by your health care provider. Make sure you discuss any questions you have with your health care provider. Document Revised: 07/25/2018 Document Reviewed: 03/07/2017 Elsevier Patient Education  [place].

## 2019-04-19 ENCOUNTER — Telehealth: Payer: Self-pay | Admitting: *Deleted

## 2019-04-19 NOTE — Telephone Encounter (Signed)
Staff message sent to [facility] per [employer]  No PA is required for sleep study. Ok to schedule. Call reference # [id number].

## 2019-04-23 ENCOUNTER — Telehealth: Payer: Self-pay | Admitting: Emergency Medicine

## 2019-04-23 NOTE — Telephone Encounter (Signed)
Left message for patient to return call. Sleep study appointment March 18th 2021 and covid test appointment march 16th, 2021 at 1135 am.

## 2019-04-23 NOTE — Telephone Encounter (Signed)
Patient will not be in town on the 17th, 18th or 19th of March but any other dates should work fine. Please call to reschedule.

## 2019-04-26 ENCOUNTER — Ambulatory Visit (HOSPITAL_BASED_OUTPATIENT_CLINIC_OR_DEPARTMENT_OTHER): Payer: BC Managed Care – PPO

## 2019-05-01 ENCOUNTER — Other Ambulatory Visit: Payer: Self-pay | Admitting: Osteopathic Medicine

## 2019-05-01 NOTE — Telephone Encounter (Addendum)
Left message for patient to return call. Looks like he was already rescheduled. Want to make sure he is aware.

## 2019-05-07 ENCOUNTER — Other Ambulatory Visit (HOSPITAL_COMMUNITY): Payer: BC Managed Care – PPO

## 2019-05-09 ENCOUNTER — Encounter (HOSPITAL_BASED_OUTPATIENT_CLINIC_OR_DEPARTMENT_OTHER): Payer: BC Managed Care – PPO | Admitting: Cardiovascular Disease

## 2019-05-09 NOTE — Telephone Encounter (Signed)
Called patient made sure he is aware of his sleep study appointment he is. No further questions.

## 2019-05-16 ENCOUNTER — Other Ambulatory Visit (HOSPITAL_COMMUNITY)
Admission: RE | Admit: 2019-05-16 | Discharge: 2019-05-16 | Disposition: A | Discharge status: Home / Self Care | Payer: BC Managed Care – PPO | Source: Ambulatory Visit | Attending: Cardiovascular Disease | Admitting: Cardiovascular Disease

## 2019-05-16 DIAGNOSIS — Z01812 Encounter for preprocedural laboratory examination: Secondary | ICD-10-CM | POA: Insufficient documentation

## 2019-05-16 DIAGNOSIS — Z20822 Contact with and (suspected) exposure to covid-19: Secondary | ICD-10-CM | POA: Insufficient documentation

## 2019-05-16 LAB — SARS CORONAVIRUS 2 (TAT 6-24 HRS): SARS Coronavirus 2: NEGATIVE

## 2019-05-19 ENCOUNTER — Ambulatory Visit (HOSPITAL_BASED_OUTPATIENT_CLINIC_OR_DEPARTMENT_OTHER): Payer: BC Managed Care – PPO | Attending: Cardiology | Admitting: Cardiovascular Disease

## 2019-05-19 ENCOUNTER — Other Ambulatory Visit: Payer: Self-pay

## 2019-05-19 DIAGNOSIS — R0902 Hypoxemia: Secondary | ICD-10-CM | POA: Diagnosis not present

## 2019-05-19 DIAGNOSIS — Z79899 Other long term (current) drug therapy: Secondary | ICD-10-CM | POA: Diagnosis not present

## 2019-05-19 DIAGNOSIS — I493 Ventricular premature depolarization: Secondary | ICD-10-CM | POA: Diagnosis not present

## 2019-05-19 DIAGNOSIS — R0683 Snoring: Secondary | ICD-10-CM | POA: Insufficient documentation

## 2019-05-19 DIAGNOSIS — G4733 Obstructive sleep apnea (adult) (pediatric): Secondary | ICD-10-CM | POA: Diagnosis not present

## 2019-05-19 DIAGNOSIS — R400 Somnolence: Secondary | ICD-10-CM | POA: Diagnosis not present

## 2019-05-19 DIAGNOSIS — I480 Paroxysmal atrial fibrillation: Secondary | ICD-10-CM

## 2019-05-19 DIAGNOSIS — Z7901 Long term (current) use of anticoagulants: Secondary | ICD-10-CM | POA: Diagnosis not present

## 2019-05-24 NOTE — Procedures (Signed)
Patient Name: Dylan Vaughn, Dylan Vaughn Date: 05/19/2019 Gender: Male D.O.B: 26-May-1970 Age (years): 44 Referring Provider: Park Liter Height (inches): 24 Interpreting Physician: Shelva Majestic MD, ABSM Weight (lbs): 310 RPSGT: Baxter Flattery BMI: 38 MRN: [id number] Neck Size: 19.50  CLINICAL INFORMATION Sleep Study Type: NPSG  Indication for sleep study: Hypertension, Snoring, Witnesses Apnea / Gasping During Sleep, PAF  Epworth Sleepiness Score: 7  SLEEP STUDY TECHNIQUE As per the AASM Manual for the Scoring of Sleep and Associated Events v2.3 (April 2016) with a hypopnea requiring 4% desaturations.  The channels recorded and monitored were frontal, central and occipital EEG, electrooculogram (EOG), submentalis EMG (chin), nasal and oral airflow, thoracic and abdominal wall motion, anterior tibialis EMG, snore microphone, electrocardiogram, and pulse oximetry.  MEDICATIONS lisinopril-hydrochlorothiazide (ZESTORETIC) 20-25 MG tablet niacin (NIASPAN) 1000 MG CR tablet rosuvastatin (CRESTOR) 20 MG tablet metoprolol succinate (TOPROL-XL) 50 MG 24 hr tablet(Expired) omega-3 acid ethyl esters (LOVAZA) 1 g capsule potassium chloride SA (KLOR-CON) 20 MEQ tablet Medications self-administered by patient taken the night of the study : N/A  SLEEP ARCHITECTURE The study was initiated at 9:58:31 PM and ended at 5:08:12 AM.  Sleep onset time was 9.1 minutes and the sleep efficiency was 74.5%%. The total sleep time was 320 minutes.  Stage REM latency was 197.0 minutes.  The patient spent 6.3%% of the night in stage N1 sleep, 84.5%% in stage N2 sleep, 0.0%% in stage N3 and 9.2% in REM.  Alpha intrusion was absent.  Supine sleep was 49.69%.  RESPIRATORY PARAMETERS The overall apnea/hypopnea index (AHI) was 11.4 per hour.  The respiratory disturnabce index (RDI) was 19.7/h. There were 0 total apneas, including 0 obstructive, 0 central and 0 mixed apneas. There were 61  hypopneas and 44 RERAs.  The AHI during Stage REM sleep was 0.0 per hour.  AHI while supine was 22.6 per hour.  The mean oxygen saturation was 93.2%. The minimum SpO2 during sleep was 89.0%.  Loud snoring was noted during this study.  CARDIAC DATA The 2 lead EKG demonstrated sinus rhythm. The mean heart rate was 60.0 beats per minute. Other EKG findings include: PVCs.  LEG MOVEMENT DATA The total PLMS were 0 with a resulting PLMS index of 0.0. Associated arousal with leg movement index was 0.0 .  IMPRESSIONS - Mild obstructive sleep apnea overall (AHI 11.4/h; RDI 19.7); however, events were moderate during supione soleep (AHI 22.6/h).  - No significant central sleep apnea occurred during this study (CAI = 0.0/h). - The patient had minimal oxygen desaturation during the study (Min O2 = 89.0%) - The patient snored with moderate snoring volume. - EKG findings include PVCs. - Clinically significant periodic limb movements did not occur during sleep. No significant associated arousals.  DIAGNOSIS - Obstructive Sleep Apnea (327.23 [G47.33 ICD-10]) - Nocturnal Hypoxemia (327.26 [G47.36 ICD-10])  RECOMMENDATIONS - Therapeutic CPAP titration to determine optimal pressure required to alleviate sleep disordered breathing. - Effort should be made to optimize nasal and oropharyngeal patency. - Positional therapy avoiding supine position during sleep. - Avoid alcohol, sedatives and other CNS depressants that may worsen sleep apnea and disrupt normal sleep architecture. - Sleep hygiene should be reviewed to assess factors that may improve sleep quality. - Weight management (BMI 38) and regular exercise should be initiated or continued if appropriate.  [Electronically signed] 05/24/2019 05:42 PM  Shelva Majestic MD, [facility], [place], American Board of Sleep Medicine   NPI: [id number] [place] PH: [phone]   FX: [phone] ACCREDITED BY THE AMERICAN  ACADEMY OF SLEEP MEDICINE

## 2019-06-04 ENCOUNTER — Telehealth: Payer: Self-pay | Admitting: *Deleted

## 2019-06-04 DIAGNOSIS — G4733 Obstructive sleep apnea (adult) (pediatric): Secondary | ICD-10-CM

## 2019-06-04 NOTE — Telephone Encounter (Signed)
Informed patient of sleep study results and patient understanding was verbalized. Patient understands his sleep study showed Patient understands his sleep study showed Mild obstructive sleep apnea overall (AHI 11.4/h; RDI 19.7); however, events were moderate during supione soleep (AHI 22.6/h).  Pt is aware and agreeable to Therapeutic CPAP titration to determine optimal pressure required to alleviate sleep disordered breathing. cpap titrarion sent to sleep lab.

## 2019-06-04 NOTE — Telephone Encounter (Signed)
-----   Message from Troy Sine, MD sent at 05/24/2019  5:48 PM EDT ----- Mariann Laster, please notify pt and schedule for CPAP titration study.

## 2019-06-18 NOTE — Telephone Encounter (Signed)
Patient is scheduled for CPAP Titration on 07/05/19. pt is scheduled for COVID screening on 07/03/19 1:45 pm prior to titration.  Patient understands his titration study will be done at [facility] sleep lab. Patient understands he will receive a letter in a week or so detailing appointment, date, time, and location. Patient understands to call if he does not receive the letter  in a timely manner.  Left detailed message on voicemail with date and time of titration and informed patient to call back to confirm or reschedule.

## 2019-06-22 ENCOUNTER — Other Ambulatory Visit (HOSPITAL_COMMUNITY): Payer: BC Managed Care – PPO

## 2019-06-25 ENCOUNTER — Encounter (HOSPITAL_BASED_OUTPATIENT_CLINIC_OR_DEPARTMENT_OTHER): Payer: BC Managed Care – PPO | Admitting: Cardiovascular Disease

## 2019-07-01 ENCOUNTER — Ambulatory Visit (INDEPENDENT_AMBULATORY_CARE_PROVIDER_SITE_OTHER): Payer: BC Managed Care – PPO | Admitting: Osteopathic Medicine

## 2019-07-01 DIAGNOSIS — Z5329 Procedure and treatment not carried out because of patient's decision for other reasons: Secondary | ICD-10-CM

## 2019-07-01 NOTE — Progress Notes (Signed)
Called pt at 114 pm, no answer. Left a vm msg.

## 2019-07-03 ENCOUNTER — Other Ambulatory Visit (HOSPITAL_COMMUNITY)
Admission: RE | Admit: 2019-07-03 | Discharge: 2019-07-03 | Disposition: A | Discharge status: Home / Self Care | Payer: BC Managed Care – PPO | Source: Ambulatory Visit | Attending: Internal Medicine | Admitting: Internal Medicine

## 2019-07-03 DIAGNOSIS — Z01812 Encounter for preprocedural laboratory examination: Secondary | ICD-10-CM | POA: Diagnosis not present

## 2019-07-03 DIAGNOSIS — Z20822 Contact with and (suspected) exposure to covid-19: Secondary | ICD-10-CM | POA: Diagnosis not present

## 2019-07-03 LAB — SARS CORONAVIRUS 2 (TAT 6-24 HRS): SARS Coronavirus 2: NEGATIVE

## 2019-07-04 NOTE — Progress Notes (Signed)
NO SHOW - late cancel

## 2019-07-05 ENCOUNTER — Other Ambulatory Visit: Payer: Self-pay

## 2019-07-05 ENCOUNTER — Ambulatory Visit (HOSPITAL_BASED_OUTPATIENT_CLINIC_OR_DEPARTMENT_OTHER): Payer: BC Managed Care – PPO | Attending: Cardiovascular Disease | Admitting: Cardiovascular Disease

## 2019-07-05 DIAGNOSIS — G4733 Obstructive sleep apnea (adult) (pediatric): Secondary | ICD-10-CM | POA: Diagnosis not present

## 2019-07-09 ENCOUNTER — Ambulatory Visit: Payer: BC Managed Care – PPO | Admitting: Cardiology

## 2019-07-12 ENCOUNTER — Other Ambulatory Visit: Payer: Self-pay

## 2019-07-12 ENCOUNTER — Encounter: Payer: Self-pay | Admitting: Cardiology

## 2019-07-12 ENCOUNTER — Ambulatory Visit: Payer: BC Managed Care – PPO | Admitting: Cardiology

## 2019-07-12 VITALS — BP 122/78 | HR 76 | Ht 78.0 in | Wt 300.0 lb

## 2019-07-12 DIAGNOSIS — E782 Mixed hyperlipidemia: Secondary | ICD-10-CM | POA: Diagnosis not present

## 2019-07-12 DIAGNOSIS — I10 Essential (primary) hypertension: Secondary | ICD-10-CM

## 2019-07-12 DIAGNOSIS — G4733 Obstructive sleep apnea (adult) (pediatric): Secondary | ICD-10-CM

## 2019-07-12 DIAGNOSIS — I480 Paroxysmal atrial fibrillation: Secondary | ICD-10-CM

## 2019-07-12 NOTE — Patient Instructions (Signed)
Medication Instructions:  Your physician recommends that you continue on your current medications as directed. Please refer to the Current Medication list given to you today.  *If you need a refill on your cardiac medications before your next appointment, please call your pharmacy*   Lab Work: None If you have labs (blood work) drawn today and your tests are completely normal, you will receive your results only by: MyChart Message (if you have MyChart) OR A paper copy in the mail If you have any lab test that is abnormal or we need to change your treatment, we will call you to review the results.   Testing/Procedures: None   Follow-Up: At [facility], you and your health needs are our priority.  As part of our continuing mission to provide you with exceptional heart care, we have created designated Provider Care Teams.  These Care Teams include your primary Cardiologist (physician) and Advanced Practice Providers (APPs -  Physician Assistants and Nurse Practitioners) who all work together to provide you with the care you need, when you need it.  We recommend signing up for the patient portal called "MyChart".  Sign up information is provided on this After Visit Summary.  MyChart is used to connect with patients for Virtual Visits (Telemedicine).  Patients are able to view lab/test results, encounter notes, upcoming appointments, etc.  Non-urgent messages can be sent to your provider as well.   To learn more about what you can do with MyChart, go to [email or web address].    Your next appointment:   5 month(s)  The format for your next appointment:   In Person  Provider:   Ashkan Krasowski, MD{    Other Instructions    

## 2019-07-12 NOTE — Progress Notes (Signed)
Cardiology Office Note:    Date:  07/12/2019   ID:  Dylan Vaughn, DOB [birth year], MRN [id number]  PCP:  Emeterio Reeve, DO  Cardiologist:  Jenne Campus, MD    Referring MD: Emeterio Reeve, DO   Chief Complaint  Patient presents with  . Follow-up  Doing better  History of Present Illness:    Dylan Vaughn is a 49 y.o. male who was referred to [place] originally because of paroxysmal atrial fibrillation.  His CHA2DS2-VASc score equals 1, he is not anticoagulated, echocardiogram showed normal left ventricle ejection fraction with normal left atrial size.  He ended up having sleep study done which showed significant sleep apnea.  He did have a titration study and awaiting his equipment.  He was also find to have some alcohol drinking problem.  That being controlled right now on top of that he is obese and he started working on it he joined a gym and started losing weight.  Overall doing well.  Denies have any palpitations no chest pain tightness squeezing pressure burning chest no shortness of breath.  Also initially he was find to have some sick skin lesion on his food rising possibility for cholesterol emboli.  He is supposed to have arterial duplex of lower extremities but he never showed up for that.  Past Medical History:  Diagnosis Date  . Blood transfusion without reported diagnosis    2000 with colectomy   . Diverticular disease 04/25/2018  . Diverticulitis of colon with bleeding 2000  . Elevated hemoglobin A1c measurement 04/30/2018  . Essential hypertension 04/25/2018  . High blood pressure   . High cholesterol   . History of thyroid disorder 04/25/2018   S/p biopsy WNL  . Hypertriglyceridemia 08/21/2018  . Impaired glucose tolerance 05/22/2018   Per record review, fasting Glc elevated but A1C 12/2016 was 5.5  . Mixed hyperlipidemia 04/25/2018  . Obstructive sleep apnea 02/18/2019  . Paroxysmal atrial fibrillation ([place]) 02/18/2019    Past Surgical History:  Procedure  Laterality Date  . COLECTOMY  2000   Ruptured diverticulum- ascending and transverse colon removed   . COLONOSCOPY  2000   ruptured diverticulum   . EYE SURGERY     1978/1988  . INGUINAL HERNIA REPAIR  1976  . POSTERIOR CRUCIATE LIGAMENT RECONSTRUCTION  1991  . UPPER GASTROINTESTINAL ENDOSCOPY     2000    Current Medications: Current Meds  Medication Sig  . lisinopril-hydrochlorothiazide (ZESTORETIC) 20-25 MG tablet TAKE 1 TABLET DAILY  . metoprolol succinate (TOPROL-XL) 50 MG 24 hr tablet Take 1 tablet (50 mg total) by mouth daily. Take with or immediately following a meal.  . niacin (NIASPAN) 1000 MG CR tablet TAKE 1 TABLET AT BEDTIME  . omega-3 acid ethyl esters (LOVAZA) 1 g capsule Take 2 capsules (2 g total) by mouth 2 (two) times daily.  . rosuvastatin (CRESTOR) 20 MG tablet TAKE 1 TABLET AT BEDTIME     Allergies:   Belladonna alkaloids   Social History   Socioeconomic History  . Marital status: Married    Spouse name: Not on file  . Number of children: Not on file  . Years of education: Not on file  . Highest education level: Not on file  Occupational History    Employer: [employer]  Tobacco Use  . Smoking status: Never Smoker  . Smokeless tobacco: Never Used  Substance and Sexual Activity  . Alcohol use: Yes    Alcohol/week: 6.0 standard drinks    Types: 6 Standard drinks or  equivalent per week    Comment: socially   . Drug use: Never  . Sexual activity: Yes    Birth control/protection: Other-see comments    Comment: Vasectomy  Other Topics Concern  . Not on file  Social History Narrative  . Not on file   Social Determinants of Health   Financial Resource Strain:   . Difficulty of Paying Living Expenses:   Food Insecurity:   . Worried About Charity fundraiser in the Last Year:   . Arboriculturist in the Last Year:   Transportation Needs:   . Film/video editor (Medical):   Marland Kitchen Lack of Transportation (Non-Medical):   Physical Activity:   .  Days of Exercise per Week:   . Minutes of Exercise per Session:   Stress:   . Feeling of Stress :   Social Connections:   . Frequency of Communication with Friends and Family:   . Frequency of Social Gatherings with Friends and Family:   . Attends Religious Services:   . Active Member of Clubs or Organizations:   . Attends Archivist Meetings:   Marland Kitchen Marital Status:      Family History: The patient's family history includes Colon polyps in his sister; Heart attack in his father; High blood pressure in his father. There is no history of Colon cancer, Esophageal cancer, Rectal cancer, or Stomach cancer. ROS:   Please see the history of present illness.    All 14 point review of systems negative except as described per history of present illness  EKGs/Labs/Other Studies Reviewed:      Recent Labs: 02/03/2019: ALT 31; BUN 7; Creatinine, Ser 0.82; Hemoglobin 15.6; Platelets 253; Potassium 3.4; Sodium 138 02/18/2019: TSH 0.405  Recent Lipid Panel    Component Value Date/Time   CHOL 128 12/24/2018 0820   TRIG 223 (H) 12/24/2018 0820   HDL 38 (L) 12/24/2018 0820   CHOLHDL 3.4 12/24/2018 0820   LDLCALC 62 12/24/2018 0820    Physical Exam:    VS:  BP 122/78   Pulse 76   Ht 6\' 6"  (1.981 m)   Wt 300 lb (136.1 kg)   SpO2 94%   BMI 34.67 kg/m     Wt Readings from Last 3 Encounters:  07/12/19 300 lb (136.1 kg)  07/05/19 (!) 310 lb (140.6 kg)  05/19/19 (!) 310 lb (140.6 kg)     GEN:  Well nourished, well developed in no acute distress HEENT: Normal NECK: No JVD; No carotid bruits LYMPHATICS: No lymphadenopathy CARDIAC: RRR, no murmurs, no rubs, no gallops RESPIRATORY:  Clear to auscultation without rales, wheezing or rhonchi  ABDOMEN: Soft, non-tender, non-distended MUSCULOSKELETAL:  No edema; No deformity  SKIN: Warm and dry LOWER EXTREMITIES: no swelling NEUROLOGIC:  Alert and oriented x 3 PSYCHIATRIC:  Normal affect   ASSESSMENT:    1. Paroxysmal atrial  fibrillation (HCC)   2. Essential hypertension   3. Obstructive sleep apnea   4. Mixed hyperlipidemia    PLAN:    In order of problems listed above:  1. Paroxysmal atrial fibrillation doing well from that point review he cut down alcohol, sleep study has been done he was identified to have sleep apnea and that started being treated appropriately.  Since that time he does not have any issues. 2. Essential hypertension his blood pressure is perfect today 122/78.  We will continue present management. 3. Obstructive sleep apnea awaiting equipment to start proper treatment of sleep apnea. 4. Mixed dyslipidemia he  is on Crestor and Niaspan, is also taking Lovaza.  He is scheduled to have fasting lipid profile by his primary care physician.  I wanted to do his fasting lipid profile today but he prefers primary care physician to do it. 5. Overall he is making good progress.  I see him back in about 5 months   Medication Adjustments/Labs and Tests Ordered: Current medicines are reviewed at length with the patient today.  Concerns regarding medicines are outlined above.  No orders of the defined types were placed in this encounter.  Medication changes: No orders of the defined types were placed in this encounter.   Signed, Park Liter, MD, [facility] 07/12/2019 9:27 AM    [place]

## 2019-07-17 ENCOUNTER — Encounter: Payer: Self-pay | Admitting: Osteopathic Medicine

## 2019-07-17 ENCOUNTER — Ambulatory Visit (INDEPENDENT_AMBULATORY_CARE_PROVIDER_SITE_OTHER): Payer: BC Managed Care – PPO | Admitting: Osteopathic Medicine

## 2019-07-17 VITALS — BP 135/77 | HR 87 | Temp 98.3°F | Wt 303.1 lb

## 2019-07-17 DIAGNOSIS — E782 Mixed hyperlipidemia: Secondary | ICD-10-CM

## 2019-07-17 DIAGNOSIS — Z8639 Personal history of other endocrine, nutritional and metabolic disease: Secondary | ICD-10-CM

## 2019-07-17 DIAGNOSIS — Z23 Encounter for immunization: Secondary | ICD-10-CM | POA: Diagnosis not present

## 2019-07-17 DIAGNOSIS — Z0000 Encounter for general adult medical examination without abnormal findings: Secondary | ICD-10-CM

## 2019-07-17 DIAGNOSIS — G4733 Obstructive sleep apnea (adult) (pediatric): Secondary | ICD-10-CM

## 2019-07-17 DIAGNOSIS — R7302 Impaired glucose tolerance (oral): Secondary | ICD-10-CM

## 2019-07-17 DIAGNOSIS — I10 Essential (primary) hypertension: Secondary | ICD-10-CM

## 2019-07-17 NOTE — Progress Notes (Signed)
Dylan Vaughn is a 49 y.o. male who presents to  [facility]  today, 07/17/19, seeking care for the following:  Annual physical      ASSESSMENT & PLAN with other pertinent history/findings:  The primary encounter diagnosis was Annual physical exam. Diagnoses of Need for shingles vaccine, Essential hypertension, Obstructive sleep apnea, Impaired glucose tolerance, Mixed hyperlipidemia, and History of thyroid disorder were also pertinent to this visit.     Patient Instructions  General Preventive Care  Most recent routine screening labs: ordered today.   Everyone should have blood pressure checked once per year. Goal 130/80 or less.   Tobacco: don't!   Alcohol: responsible moderation is ok for most adults - if you have concerns about your alcohol intake, please talk to me!   Exercise: as tolerated to reduce risk of cardiovascular disease and diabetes. Strength training will also prevent osteoporosis.   Mental health: if need for mental health care (medicines, counseling, other), or concerns about moods, please let me know!   Sexual health: if need for STD testing, or if concerns with libido/pain problems, please let me know!   Advanced Directive: Living Will and/or Healthcare Power of Attorney recommended for all adults, regardless of age or health.  Vaccines  Flu vaccine: for almost everyone, every fall.   Shingles vaccine: after age 94.   Pneumonia vaccines: after age 37, or sooner if certain medical conditions.  Tetanus booster: every 10 years   COVID vaccine: thanks! :) Cancer screenings   Colon cancer screening: for everyone age 42-75. Colonoscopy due 2027  Prostate cancer screening: PSA blood test age 37-71  Lung cancer screening: not needed for non-smokers  Infection screenings  . HIV: recommended screening at least once age 42-65, more often as needed. . Gonorrhea/Chlamydia: screening as  needed . Hepatitis C: recommended once for everyone age 4-75 . TB: certain at-risk populations, or depending on work requirements and/or travel history Other . Bone Density Test: recommended age 12 . Abdominal Aortic Aneurysm: screening with ultrasound recommended once for men age 47-75 who have ever smoked     Orders Placed This Encounter  Procedures  . Varicella-zoster vaccine IM (Shingrix)  . CBC  . COMPLETE METABOLIC PANEL WITH GFR  . Lipid panel  . Hemoglobin A1c  . TSH    No orders of the defined types were placed in this encounter.  Constitutional:  . VSS, see nurse notes . General Appearance: alert, well-developed, well-nourished, NAD Eyes: Marland Kitchen Normal lids and conjunctive, non-icteric sclera Neck: . No masses, trachea midline . No thyroid enlargement/tenderness/mass appreciated Respiratory: . Normal respiratory effort . Breath sounds normal, no wheeze/rhonchi/rales Cardiovascular: . S1/S2 normal, no murmur/rub/gallop auscultated . No lower extremity edema Gastrointestinal: . Nontender, no masses . No hepatomegaly, no splenomegaly . No hernia appreciated Musculoskeletal:  . Gait normal . No clubbing/cyanosis of digits Neurological: . No cranial nerve deficit on limited exam . Motor and sensation intact and symmetric Psychiatric: . Normal judgment/insight . Normal mood and affect     Follow-up instructions: Return for ANNUAL (get labs prior to visit, orders are in) in 1 year, nurse visit Shingrix #2 in 2-6 mos .                                         BP 135/77 (BP Location: Right Arm, Patient Position: Sitting, Cuff Size: Large)  Pulse 87   Temp 98.3 F (36.8 C) (Oral)   Wt (!) 303 lb 1.3 oz (137.5 kg)   BMI 35.02 kg/m   Current Meds  Medication Sig  . lisinopril-hydrochlorothiazide (ZESTORETIC) 20-25 MG tablet TAKE 1 TABLET DAILY  . niacin (NIASPAN) 1000 MG CR tablet TAKE 1 TABLET AT BEDTIME  .  rosuvastatin (CRESTOR) 20 MG tablet TAKE 1 TABLET AT BEDTIME    No results found for this or any previous visit (from the past 72 hour(s)).  No results found.  Depression screen [facility] 2/9 07/17/2019 12/26/2018 08/21/2018  Decreased Interest 0 0 0  Down, Depressed, Hopeless 0 0 0  PHQ - 2 Score 0 0 0  Altered sleeping 1 - 1  Tired, decreased energy 0 - 1  Change in appetite 0 - 1  Feeling bad or failure about yourself  0 - 0  Trouble concentrating 0 - 0  Moving slowly or fidgety/restless 0 - 0  Suicidal thoughts 0 - 0  PHQ-9 Score 1 - 3  Difficult doing work/chores Not difficult at all - Not difficult at all    GAD 7 : Generalized Anxiety Score 07/17/2019 12/26/2018 08/21/2018  Nervous, Anxious, on Edge 0 0 0  Control/stop worrying 0 0 0  Worry too much - different things 0 0 0  Trouble relaxing 0 0 0  Restless 0 0 0  Easily annoyed or irritable 0 0 0  Afraid - awful might happen 0 0 0  Total GAD 7 Score 0 0 0  Anxiety Difficulty - - Not difficult at all      All questions at time of visit were answered - patient instructed to contact office with any additional concerns or updates.  ER/RTC precautions were reviewed with the patient.  Please note: voice recognition software was used to produce this document, and typos may escape review. Please contact Dr. Sheppard Coil for any needed clarifications.

## 2019-07-17 NOTE — Patient Instructions (Signed)
General Preventive Care  Most recent routine screening labs: ordered today.   Everyone should have blood pressure checked once per year. Goal 130/80 or less.   Tobacco: don't!   Alcohol: responsible moderation is ok for most adults - if you have concerns about your alcohol intake, please talk to me!   Exercise: as tolerated to reduce risk of cardiovascular disease and diabetes. Strength training will also prevent osteoporosis.   Mental health: if need for mental health care (medicines, counseling, other), or concerns about moods, please let me know!   Sexual health: if need for STD testing, or if concerns with libido/pain problems, please let me know!   Advanced Directive: Living Will and/or Healthcare Power of Attorney recommended for all adults, regardless of age or health.  Vaccines  Flu vaccine: for almost everyone, every fall.   Shingles vaccine: after age 53.   Pneumonia vaccines: after age 43, or sooner if certain medical conditions.  Tetanus booster: every 10 years   COVID vaccine: thanks! :) Cancer screenings   Colon cancer screening: for everyone age 24-75. Colonoscopy due 2027  Prostate cancer screening: PSA blood test age 20-71  Lung cancer screening: not needed for non-smokers  Infection screenings  . HIV: recommended screening at least once age 19-65, more often as needed. . Gonorrhea/Chlamydia: screening as needed . Hepatitis C: recommended once for everyone age 81-75 . TB: certain at-risk populations, or depending on work requirements and/or travel history Other . Bone Density Test: recommended age 54 . Abdominal Aortic Aneurysm: screening with ultrasound recommended once for men age 57-75 who have ever smoked

## 2019-07-22 ENCOUNTER — Encounter (HOSPITAL_BASED_OUTPATIENT_CLINIC_OR_DEPARTMENT_OTHER): Payer: Self-pay | Admitting: Cardiovascular Disease

## 2019-07-22 NOTE — Procedures (Signed)
Patient Name: Dylan Vaughn, Dylan Vaughn Date: 07/05/2019 Gender: Male D.O.B: 1970/06/01 Age (years): 89 Referring Provider: Park Liter Height (inches): 76 Interpreting Physician: Shelva Majestic MD, ABSM Weight (lbs): 310 RPSGT: Baxter Flattery BMI: 54 MRN: [id number] Neck Size: 19.50  CLINICAL INFORMATION The patient is referred for a CPAP titration to treat sleep apnea.  Date of NPSG: 05/19/2019:  AHI 11.4/h; RDI 19.7/h; supine AHI 22.6/h; O2 nadir 89%.  SLEEP STUDY TECHNIQUE As per the AASM Manual for the Scoring of Sleep and Associated Events v2.3 (April 2016) with a hypopnea requiring 4% desaturations.  The channels recorded and monitored were frontal, central and occipital EEG, electrooculogram (EOG), submentalis EMG (chin), nasal and oral airflow, thoracic and abdominal wall motion, anterior tibialis EMG, snore microphone, electrocardiogram, and pulse oximetry. Continuous positive airway pressure (CPAP) was initiated at the beginning of the study and titrated to treat sleep-disordered breathing.  MEDICATIONS lisinopril-hydrochlorothiazide (ZESTORETIC) 20-25 MG tablet metoprolol succinate (TOPROL-XL) 50 MG 24 hr tablet(Expired) niacin (NIASPAN) 1000 MG CR tablet rosuvastatin (CRESTOR) 20 MG tablet Medications self-administered by patient taken the night of the study : N/A  TECHNICIAN COMMENTS Comments added by technician: [place] Comments added by scorer: N/A  RESPIRATORY PARAMETERS Optimal PAP Pressure (cm): 10 AHI at Optimal Pressure (/hr): 0.0 Overall Minimal O2 (%): 90.0 Supine % at Optimal Pressure (%): 88 Minimal O2 at Optimal Pressure (%): 94.0   SLEEP ARCHITECTURE The study was initiated at 9:51:52 PM and ended at 4:50:52 AM.  Sleep onset time was 2.5 minutes and the sleep efficiency was 83.7%%. The total sleep time was 350.5 minutes.  The patient spent 2.1%% of the night in stage N1 sleep, 81.2%% in stage N2 sleep, 0.0%% in stage  N3 and 16.7% in REM.Stage REM latency was 144.5 minutes  Wake after sleep onset was 66.0. Alpha intrusion was absent. Supine sleep was 67.19%.  CARDIAC DATA The 2 lead EKG demonstrated sinus rhythm. The mean heart rate was 58.8 beats per minute. Other EKG findings include: None.  LEG MOVEMENT DATA The total Periodic Limb Movements of Sleep (PLMS) were 0. The PLMS index was 0.0. A PLMS index of <15 is considered normal in adults.  IMPRESSIONS - CPAP was initiated at 5 cm and was titrated to optimal at 10 cm water pressure; AHI 0, O2 nadir 94% - Central sleep apnea was not noted during this titration (CAI = 0.0/h). - Significant oxygen desaturations were not observed during this titration (min O2 = 90.0%). - No snoring was audible during this study. - No cardiac abnormalities were observed during this study. - Clinically significant periodic limb movements were not noted during this study. Arousals associated with PLMs were rare.  DIAGNOSIS - Obstructive Sleep Apnea (327.23 [G47.33 ICD-10])  RECOMMENDATIONS - Recommend an initial trial of CPAP therapy with EPR at 10 cm H2O with heated humidification.  A Large size Fisher&Paykel Full Face Mask Simplus mask was used foe the titration. - Effort should be made to optimize nasal and oropharyngeal patency. - Avoid alcohol, sedatives and other CNS depressants that may worsen sleep apnea and disrupt normal sleep architecture. - Sleep hygiene should be reviewed to assess factors that may improve sleep quality. - Weight management (BMI 38) and regular exercise should be initiated or continued. - Recommend a download in 30 days and sleep Center evaluation after 4 weeks of therapy   [Electronically signed] 07/22/2019 10:28 PM  Shelva Majestic MD, [facility], ABSM Diplomate, American Board of Sleep Medicine   NPI:  [id number] [place] PH: [phone]   FX: [phone] [place]

## 2019-07-27 ENCOUNTER — Other Ambulatory Visit: Payer: Self-pay

## 2019-07-27 ENCOUNTER — Emergency Department
Admission: EM | Admit: 2019-07-27 | Discharge: 2019-07-27 | Disposition: A | Discharge status: Home / Self Care | Payer: BC Managed Care – PPO | Source: Home / Self Care | Attending: Family Medicine | Admitting: Family Medicine

## 2019-07-27 DIAGNOSIS — R300 Dysuria: Secondary | ICD-10-CM | POA: Diagnosis not present

## 2019-07-27 DIAGNOSIS — N390 Urinary tract infection, site not specified: Secondary | ICD-10-CM

## 2019-07-27 LAB — POCT CBC W AUTO DIFF (K'VILLE URGENT CARE)

## 2019-07-27 LAB — POCT URINALYSIS DIP (MANUAL ENTRY)
Glucose, UA: NEGATIVE mg/dL
Ketones, POC UA: NEGATIVE mg/dL
Nitrite, UA: POSITIVE — AB
Protein Ur, POC: 100 mg/dL — AB
Spec Grav, UA: 1.025 (ref 1.010–1.025)
Urobilinogen, UA: >=8 E.U./dL — AB
pH, UA: 6 (ref 5.0–8.0)

## 2019-07-27 MED ORDER — CEFTRIAXONE SODIUM 1 G IJ SOLR
1000.0000 mg | Freq: Once | INTRAMUSCULAR | Status: AC
  Administered 2019-07-27: 1000 mg via INTRAMUSCULAR

## 2019-07-27 MED ORDER — CIPROFLOXACIN HCL 500 MG PO TABS
ORAL_TABLET | ORAL | 0 refills | Status: DC
Start: 2019-07-27 — End: 2019-12-20

## 2019-07-27 NOTE — ED Provider Notes (Signed)
Dylan Vaughn [facility]    CSN: [id number] Arrival date & time: 07/27/19  4650      History   Chief Complaint No chief complaint on file.   HPI Dylan Vaughn is a 49 y.o. male.   Patient awoke yesterday with dysuria, fatigue, and decreased urinary stream. He developed fever to 101.5 yesterday with chills/sweats.  He reports that his urine has been cloudy and dark.  He denies back pain, GI, and respiratory symptoms. He states that he passed a kidney stone about 2 years ago.  He denies past history of prostate problems.   Dysuria Presenting symptoms: dysuria   Presenting symptoms: no penile discharge, no scrotal pain and no swelling   Context: after urination   Relieved by:  None tried Worsened by:  Urination Ineffective treatments:  None tried Associated symptoms: fever and urinary hesitation   Associated symptoms: no abdominal pain, no diarrhea, no flank pain, no genital lesions, no genital rash, no groin pain, no hematuria, no nausea, no scrotal swelling, no urinary frequency, no urinary incontinence, no urinary retention and no vomiting   Risk factors: kidney stones     Past Medical History:  Diagnosis Date  . Blood transfusion without reported diagnosis    2000 with colectomy   . Diverticular disease 04/25/2018  . Diverticulitis of colon with bleeding 2000  . Elevated hemoglobin A1c measurement 04/30/2018  . Essential hypertension 04/25/2018  . High blood pressure   . High cholesterol   . History of thyroid disorder 04/25/2018   S/p biopsy WNL  . Hypertriglyceridemia 08/21/2018  . Impaired glucose tolerance 05/22/2018   Per record review, fasting Glc elevated but A1C 12/2016 was 5.5  . Mixed hyperlipidemia 04/25/2018  . Obstructive sleep apnea 02/18/2019  . Paroxysmal atrial fibrillation ([place]) 02/18/2019    Patient Active Problem List   Diagnosis Date Noted  . Obstructive sleep apnea 02/18/2019  . Paroxysmal atrial fibrillation ([place]) 02/18/2019  . Hypertriglyceridemia  08/21/2018  . Impaired glucose tolerance 05/22/2018  . Elevated hemoglobin A1c measurement 04/30/2018  . Essential hypertension 04/25/2018  . Mixed hyperlipidemia 04/25/2018  . Diverticular disease 04/25/2018  . History of thyroid disorder 04/25/2018    Past Surgical History:  Procedure Laterality Date  . COLECTOMY  2000   Ruptured diverticulum- ascending and transverse colon removed   . COLONOSCOPY  2000   ruptured diverticulum   . EYE SURGERY     1978/1988  . INGUINAL HERNIA REPAIR  1976  . POSTERIOR CRUCIATE LIGAMENT RECONSTRUCTION  1991  . UPPER GASTROINTESTINAL ENDOSCOPY     2000       Home Medications    Prior to Admission medications   Medication Sig Start Date End Date Taking? Authorizing Provider  ciprofloxacin (CIPRO) 500 MG tablet Take one tab PO Q12hr 07/27/19   Kandra Nicolas, MD  lisinopril-hydrochlorothiazide (ZESTORETIC) 20-25 MG tablet TAKE 1 TABLET DAILY 05/01/19   Emeterio Reeve, DO  metoprolol succinate (TOPROL-XL) 50 MG 24 hr tablet Take 1 tablet (50 mg total) by mouth daily. Take with or immediately following a meal. 02/18/19 07/12/19  Park Liter, MD  niacin (NIASPAN) 1000 MG CR tablet TAKE 1 TABLET AT BEDTIME 05/01/19   Emeterio Reeve, DO  rosuvastatin (CRESTOR) 20 MG tablet TAKE 1 TABLET AT BEDTIME 05/01/19   Emeterio Reeve, DO    Family History Family History  Problem Relation Age of Onset  . High blood pressure Father   . Heart attack Father   . Colon polyps Sister   .  Colon cancer Neg Hx   . Esophageal cancer Neg Hx   . Rectal cancer Neg Hx   . Stomach cancer Neg Hx     Social History Social History   Tobacco Use  . Smoking status: Never Smoker  . Smokeless tobacco: Never Used  Substance Use Topics  . Alcohol use: Yes    Alcohol/week: 6.0 standard drinks    Types: 6 Standard drinks or equivalent per week    Comment: socially   . Drug use: Never     Allergies   Belladonna alkaloids   Review of  Systems Review of Systems  Constitutional: Positive for activity change, appetite change, chills, diaphoresis, fatigue and fever.  HENT: Negative.   Eyes: Negative.   Respiratory: Negative.   Cardiovascular: Negative.   Gastrointestinal: Negative for abdominal pain, diarrhea, nausea and vomiting.  Genitourinary: Positive for dysuria, hesitancy and urgency. Negative for bladder incontinence, discharge, flank pain, frequency, hematuria, scrotal swelling and testicular pain.  Musculoskeletal: Negative.   Skin: Negative.   Neurological: Negative for headaches.     Physical Exam Triage Vital Signs ED Triage Vitals  Enc Vitals Group     BP 07/27/19 1034 126/82     Pulse Rate 07/27/19 1034 89     Resp 07/27/19 1034 18     Temp 07/27/19 1034 99.1 F (37.3 C)     Temp Source 07/27/19 1034 Oral     SpO2 07/27/19 1034 98 %     Weight 07/27/19 1036 300 lb (136.1 kg)     Height 07/27/19 1036 6\' 6"  (1.981 m)     Head Circumference --      Peak Flow --      Pain Score 07/27/19 1036 2     Pain Loc --      Pain Edu? --      Excl. in [place]? --    No data found.  Updated Vital Signs BP 126/82 (BP Location: Left Arm)   Pulse 89   Temp 99.1 F (37.3 C) (Oral)   Resp 18   Ht 6\' 6"  (1.981 m)   Wt 136.1 kg   SpO2 98%   BMI 34.67 kg/m   Visual Acuity Right Eye Distance:   Left Eye Distance:   Bilateral Distance:    Right Eye Near:   Left Eye Near:    Bilateral Near:     Physical Exam Nursing notes and Vital Signs reviewed. Appearance:  Patient appears stated age, and in no acute distress.   He is alert. Eyes:  Pupils are equal, round, and reactive to light and accomodation.  Extraocular movement is intact.  Conjunctivae are not inflamed   Pharynx:  Normal; moist mucous membranes  Neck:  Supple.  No adenopathy Lungs:  Clear to auscultation.  Breath sounds are equal.  Moving air well. Heart:  Regular rate and rhythm without murmurs, rubs, or gallops.  Abdomen:  Nontender without  masses or hepatosplenomegaly.  Bowel sounds are present.  No CVA or flank tenderness.  Extremities:  No edema.  Skin:  No rash present.   Neurologic: Oriented X 3  UC Treatments / Results  Labs (all labs ordered are listed, but only abnormal results are displayed) Labs Reviewed  POCT URINALYSIS DIP (MANUAL ENTRY) - Abnormal; Notable for the following components:      Result Value   Clarity, UA cloudy (*)    Bilirubin, UA small (*)    Blood, UA small (*)    Protein Ur, POC =100 (*)  Urobilinogen, UA >=8.0 (*)    Nitrite, UA Positive (*)    Leukocytes, UA Moderate (2+) (*)    All other components within normal limits  URINE CULTURE  POCT CBC W AUTO DIFF ([facility]):  WBC 26.1; LY 10.9; MO 6.5; GR 82.6; Hgb 13.9; Platelets 245     EKG   Radiology No results found.  Procedures Procedures (including critical care time)  Medications Ordered in UC Medications  cefTRIAXone (ROCEPHIN) injection 1,000 mg (has no administration in time range)    Initial Impression / Assessment and Plan / UC Course  I have reviewed the triage vital signs and the nursing notes.  Pertinent labs & imaging results that were available during my care of the patient were reviewed by me and considered in my medical decision making (see chart for details).    Note significant leukocytosis (WBC 26.1), although patient appears non-toxic. Urine culture pending. Administered Rocephin 1gm IM, then begin Cipro 500mg  Q12 hr. Followup with Family Doctor in two days. Recommend follow-up with urologist in about two weeks.   Final Clinical Impressions(s) / UC Diagnoses   Final diagnoses:  Dysuria  Urinary tract infection without hematuria, site unspecified     Discharge Instructions     Increase fluid intake.  May take Ibuprofen 200mg , 4 tabs every 8 hours with food.   If symptoms become significantly worse during the night or over the weekend, proceed to the local emergency room:    You  have chills.  You feel nauseous.  You vomit.  You feel light-headed or feel like you are going to faint.  You are unable to urinate.  You have blood or blood clots in your urine.    ED Prescriptions    Medication Sig Dispense Auth. Provider   ciprofloxacin (CIPRO) 500 MG tablet Take one tab PO Q12hr 20 tablet Kandra Nicolas, MD        Kandra Nicolas, MD 07/29/19 1046

## 2019-07-27 NOTE — ED Triage Notes (Signed)
Pt c/o dysuria and difficulty urinating. Urine cloudy and dark in color. Hx of kidney stones. Had 101.5 fever yesterday.

## 2019-07-27 NOTE — Discharge Instructions (Addendum)
Increase fluid intake.  May take Ibuprofen 200mg , 4 tabs every 8 hours with food.   If symptoms become significantly worse during the night or over the weekend, proceed to the local emergency room:   You have chills. You feel nauseous. You vomit. You feel light-headed or feel like you are going to faint. You are unable to urinate. You have blood or blood clots in your urine.

## 2019-07-28 ENCOUNTER — Telehealth: Payer: Self-pay

## 2019-07-28 NOTE — Telephone Encounter (Signed)
Pt says hes urinating better, still having chills, Advised to keep hydrating and taking antibiotics and ibuprofen. Will call or f/u if anything changes.

## 2019-07-29 ENCOUNTER — Emergency Department (HOSPITAL_COMMUNITY): Payer: BC Managed Care – PPO

## 2019-07-29 ENCOUNTER — Emergency Department (HOSPITAL_COMMUNITY)
Admission: EM | Admit: 2019-07-29 | Discharge: 2019-07-29 | Disposition: A | Discharge status: Home / Self Care | Payer: BC Managed Care – PPO | Attending: Emergency Medicine | Admitting: Emergency Medicine

## 2019-07-29 ENCOUNTER — Encounter (HOSPITAL_COMMUNITY): Payer: Self-pay | Admitting: Emergency Medicine

## 2019-07-29 ENCOUNTER — Other Ambulatory Visit: Payer: Self-pay

## 2019-07-29 DIAGNOSIS — K5790 Diverticulosis of intestine, part unspecified, without perforation or abscess without bleeding: Secondary | ICD-10-CM | POA: Diagnosis not present

## 2019-07-29 DIAGNOSIS — N390 Urinary tract infection, site not specified: Secondary | ICD-10-CM | POA: Insufficient documentation

## 2019-07-29 DIAGNOSIS — R509 Fever, unspecified: Secondary | ICD-10-CM | POA: Diagnosis not present

## 2019-07-29 DIAGNOSIS — N410 Acute prostatitis: Secondary | ICD-10-CM | POA: Diagnosis not present

## 2019-07-29 DIAGNOSIS — R300 Dysuria: Secondary | ICD-10-CM | POA: Diagnosis not present

## 2019-07-29 DIAGNOSIS — I10 Essential (primary) hypertension: Secondary | ICD-10-CM | POA: Diagnosis not present

## 2019-07-29 DIAGNOSIS — I480 Paroxysmal atrial fibrillation: Secondary | ICD-10-CM | POA: Diagnosis not present

## 2019-07-29 DIAGNOSIS — Z79899 Other long term (current) drug therapy: Secondary | ICD-10-CM | POA: Insufficient documentation

## 2019-07-29 LAB — URINALYSIS, ROUTINE W REFLEX MICROSCOPIC
Bilirubin Urine: NEGATIVE
Glucose, UA: NEGATIVE mg/dL
Hgb urine dipstick: NEGATIVE
Ketones, ur: NEGATIVE mg/dL
Leukocytes,Ua: NEGATIVE
Nitrite: NEGATIVE
Protein, ur: 100 mg/dL — AB
Specific Gravity, Urine: 1.021 (ref 1.005–1.030)
pH: 5 (ref 5.0–8.0)

## 2019-07-29 LAB — CBC
HCT: 41.2 % (ref 39.0–52.0)
Hemoglobin: 13.6 g/dL (ref 13.0–17.0)
MCH: 29.4 pg (ref 26.0–34.0)
MCHC: 33 g/dL (ref 30.0–36.0)
MCV: 89.2 fL (ref 80.0–100.0)
Platelets: 185 10*3/uL (ref 150–400)
RBC: 4.62 MIL/uL (ref 4.22–5.81)
RDW: 11.9 % (ref 11.5–15.5)
WBC: 6.3 10*3/uL (ref 4.0–10.5)
nRBC: 0 % (ref 0.0–0.2)

## 2019-07-29 LAB — URINE CULTURE
MICRO NUMBER:: 10558841
SPECIMEN QUALITY:: ADEQUATE

## 2019-07-29 LAB — BASIC METABOLIC PANEL
Anion gap: 12 (ref 5–15)
BUN: 7 mg/dL (ref 6–20)
CO2: 21 mmol/L — ABNORMAL LOW (ref 22–32)
Calcium: 8.4 mg/dL — ABNORMAL LOW (ref 8.9–10.3)
Chloride: 101 mmol/L (ref 98–111)
Creatinine, Ser: 1.03 mg/dL (ref 0.61–1.24)
GFR calc Af Amer: >60 mL/min (ref >60)
GFR calc non Af Amer: >60 mL/min (ref >60)
Glucose, Bld: 216 mg/dL — ABNORMAL HIGH (ref 70–99)
Potassium: 3 mmol/L — ABNORMAL LOW (ref 3.5–5.1)
Sodium: 134 mmol/L — ABNORMAL LOW (ref 135–145)

## 2019-07-29 NOTE — Discharge Instructions (Signed)
Continue Cipro as previously prescribed.  Take Tylenol 1000 mg rotated with ibuprofen 600 mg every 4 hours as needed for fever.  We will call you if your cultures indicate you require a change in your antibiotics.  Return to the emergency department in the meantime if you develop severe weakness, dizziness, fever greater than 104, severe abdominal pain, or other new and concerning symptoms.

## 2019-07-29 NOTE — ED Provider Notes (Signed)
[facility] EMERGENCY DEPARTMENT Provider Note   CSN: [id number] Arrival date & time: 07/29/19  6811     History Chief Complaint  Patient presents with  . Dysuria    Dylan Vaughn is a 49 y.o. male.  Patient is a 49 year old male with past medical history of hypertension, hyperlipidemia, paroxysmal A. fib, and renal calculi.  He presents today for evaluation of fevers and urinary frequency.  This has been ongoing for the past several days.  He was seen in urgent care 2 days ago and had blood work and urinalysis performed.  He was told he likely has prostatitis and was prescribed Cipro.  He has been taking this medication for the past 2 days, but continues with fevers and urinary frequency.  He has tried increasing his fluid intake, however does not feel as though he is urinating as much as he is taking in.  The history is provided by the patient.  Dysuria Presenting symptoms: dysuria   Relieved by:  Nothing Worsened by:  Nothing Ineffective treatments:  None tried Associated symptoms: fever        Past Medical History:  Diagnosis Date  . Blood transfusion without reported diagnosis    2000 with colectomy   . Diverticular disease 04/25/2018  . Diverticulitis of colon with bleeding 2000  . Elevated hemoglobin A1c measurement 04/30/2018  . Essential hypertension 04/25/2018  . High blood pressure   . High cholesterol   . History of thyroid disorder 04/25/2018   S/p biopsy WNL  . Hypertriglyceridemia 08/21/2018  . Impaired glucose tolerance 05/22/2018   Per record review, fasting Glc elevated but A1C 12/2016 was 5.5  . Mixed hyperlipidemia 04/25/2018  . Obstructive sleep apnea 02/18/2019  . Paroxysmal atrial fibrillation ([place]) 02/18/2019    Patient Active Problem List   Diagnosis Date Noted  . Obstructive sleep apnea 02/18/2019  . Paroxysmal atrial fibrillation ([place]) 02/18/2019  . Hypertriglyceridemia 08/21/2018  . Impaired glucose tolerance 05/22/2018  .  Elevated hemoglobin A1c measurement 04/30/2018  . Essential hypertension 04/25/2018  . Mixed hyperlipidemia 04/25/2018  . Diverticular disease 04/25/2018  . History of thyroid disorder 04/25/2018    Past Surgical History:  Procedure Laterality Date  . COLECTOMY  2000   Ruptured diverticulum- ascending and transverse colon removed   . COLONOSCOPY  2000   ruptured diverticulum   . EYE SURGERY     1978/1988  . INGUINAL HERNIA REPAIR  1976  . POSTERIOR CRUCIATE LIGAMENT RECONSTRUCTION  1991  . UPPER GASTROINTESTINAL ENDOSCOPY     2000       Family History  Problem Relation Age of Onset  . High blood pressure Father   . Heart attack Father   . Colon polyps Sister   . Colon cancer Neg Hx   . Esophageal cancer Neg Hx   . Rectal cancer Neg Hx   . Stomach cancer Neg Hx     Social History   Tobacco Use  . Smoking status: Never Smoker  . Smokeless tobacco: Never Used  Substance Use Topics  . Alcohol use: Yes    Alcohol/week: 6.0 standard drinks    Types: 6 Standard drinks or equivalent per week    Comment: socially   . Drug use: Never    Home Medications Prior to Admission medications   Medication Sig Start Date End Date Taking? Authorizing Provider  ciprofloxacin (CIPRO) 500 MG tablet Take one tab PO Q12hr 07/27/19   Kandra Nicolas, MD  lisinopril-hydrochlorothiazide (ZESTORETIC) 20-25 MG tablet  TAKE 1 TABLET DAILY 05/01/19   Emeterio Reeve, DO  metoprolol succinate (TOPROL-XL) 50 MG 24 hr tablet Take 1 tablet (50 mg total) by mouth daily. Take with or immediately following a meal. 02/18/19 07/12/19  Park Liter, MD  niacin (NIASPAN) 1000 MG CR tablet TAKE 1 TABLET AT BEDTIME 05/01/19   Emeterio Reeve, DO  rosuvastatin (CRESTOR) 20 MG tablet TAKE 1 TABLET AT BEDTIME 05/01/19   Emeterio Reeve, DO    Allergies    Belladonna alkaloids  Review of Systems   Review of Systems  Constitutional: Positive for fever.  Genitourinary: Positive for dysuria.    All other systems reviewed and are negative.   Physical Exam Updated Vital Signs BP (!) 143/80 (BP Location: Right Arm)   Pulse (!) 102   Temp (!) 100.4 F (38 C) (Oral)   Resp 18   SpO2 95%   Physical Exam Vitals and nursing note reviewed.  Constitutional:      General: He is not in acute distress.    Appearance: He is well-developed. He is not diaphoretic.  HENT:     Head: Normocephalic and atraumatic.  Cardiovascular:     Rate and Rhythm: Normal rate and regular rhythm.     Heart sounds: No murmur. No friction rub.  Pulmonary:     Effort: Pulmonary effort is normal. No respiratory distress.     Breath sounds: Normal breath sounds. No wheezing or rales.  Abdominal:     General: Bowel sounds are normal. There is no distension.     Palpations: Abdomen is soft.     Tenderness: There is no abdominal tenderness.  Musculoskeletal:        General: Normal range of motion.     Cervical back: Normal range of motion and neck supple.  Skin:    General: Skin is warm and dry.  Neurological:     Mental Status: He is alert and oriented to person, place, and time.     Coordination: Coordination normal.     ED Results / Procedures / Treatments   Labs (all labs ordered are listed, but only abnormal results are displayed) Labs Reviewed  URINE CULTURE  BASIC METABOLIC PANEL  CBC  URINALYSIS, ROUTINE W REFLEX MICROSCOPIC    EKG None  Radiology No results found.  Procedures Procedures (including critical care time)  Medications Ordered in ED Medications - No data to display  ED Course  I have reviewed the triage vital signs and the nursing notes.  Pertinent labs & imaging results that were available during my care of the patient were reviewed by me and considered in my medical decision making (see chart for details).  MDM Rules/Calculators/A&P  Patient presenting with ongoing fever since being diagnosed with prostatitis at urgent care 2 days ago.  He was started on  Cipro and has taken approximately 4 doses of this.    He has a history of kidney stones, but today's work-up/CT shows no evidence for infected stone.  It does show inflammation around the prostate and bladder consistent with UTI/prostatitis.  In reviewing the patient's labs from urgent care, his urinalysis appears to be improving and white count is now normal.  Things seem to be trending in the right direction and I feel as though continuing the course with Cipro is appropriate.  He does have a urine culture pending from 2 days ago and another culture from today.  If these cultures indicate a bacteria resistant to Cipro, the patient will be notified and  antibiotic changed.  He does not appear septic or toxic and I believe is appropriate for discharge.  To return as needed for any problems.  Final Clinical Impression(s) / ED Diagnoses Final diagnoses:  None    Rx / DC Orders ED Discharge Orders    None       Veryl Speak, MD 07/29/19 1003

## 2019-07-29 NOTE — ED Triage Notes (Signed)
Patient arrives to ED with complaints of ongoing pain on urination, fever, and chills. Patient was dx with UTI at [facility] on 6/5 and sent home with Cipro.

## 2019-07-30 ENCOUNTER — Other Ambulatory Visit: Payer: Self-pay | Admitting: Osteopathic Medicine

## 2019-07-30 LAB — URINE CULTURE: Culture: <10000 — AB

## 2019-07-30 NOTE — Telephone Encounter (Signed)
Informed patient of sleep study results and patient understanding was verbalized. Patient understands his sleep study showed..  IMPRESSIONS - CPAP was initiated at 5 cm and was titrated to optimal at 10 cm water pressure; AHI 0, O2 nadir 94%  - DIAGNOSIS - Obstructive Sleep Apnea (327.23 [G47.33 ICD-10])  RECOMMENDATIONS - Recommend an initial trial of CPAP therapy with EPR at 10 cm H2O with heated humidification.  A Large size [employer]kel Full Face Mask Simplus mask was used foe the titration.  Pt is aware and agreeable to his results results.  Upon patient request DME selection is [facility]. Patient understands he will be contacted by [facility] to set up his cpap. Patient understands to call if [facility] does not contact him with new setup in a timely manner. Patient understands they will be called once confirmation has been received from [facility] that they have received their new machine to schedule 10 week follow up appointment.  [facility] notified of new cpap order  Please add to airview Patient was grateful for the call and thanked me.

## 2019-08-28 DIAGNOSIS — G4733 Obstructive sleep apnea (adult) (pediatric): Secondary | ICD-10-CM | POA: Diagnosis not present

## 2019-09-13 LAB — COMPLETE METABOLIC PANEL WITH GFR
AG Ratio: 1.5 (calc) (ref 1.0–2.5)
ALT: 23 U/L (ref 9–46)
AST: 15 U/L (ref 10–35)
Albumin: 4 g/dL (ref 3.6–5.1)
Alkaline phosphatase (APISO): 49 U/L (ref 35–144)
BUN: 10 mg/dL (ref 7–25)
CO2: 29 mmol/L (ref 20–32)
Calcium: 8.8 mg/dL (ref 8.6–10.3)
Chloride: 103 mmol/L (ref 98–110)
Creat: 0.75 mg/dL (ref 0.70–1.33)
GFR, Est African American: 124 mL/min/{1.73_m2} (ref >=60)
GFR, Est Non African American: 107 mL/min/{1.73_m2} (ref >=60)
Globulin: 2.6 g/dL (calc) (ref 1.9–3.7)
Glucose, Bld: 118 mg/dL — ABNORMAL HIGH (ref 65–99)
Potassium: 3.6 mmol/L (ref 3.5–5.3)
Sodium: 137 mmol/L (ref 135–146)
Total Bilirubin: 0.9 mg/dL (ref 0.2–1.2)
Total Protein: 6.6 g/dL (ref 6.1–8.1)

## 2019-09-13 LAB — CBC
HCT: 39.8 % (ref 38.5–50.0)
Hemoglobin: 13.3 g/dL (ref 13.2–17.1)
MCH: 29.6 pg (ref 27.0–33.0)
MCHC: 33.4 g/dL (ref 32.0–36.0)
MCV: 88.4 fL (ref 80.0–100.0)
MPV: 10.1 fL (ref 7.5–12.5)
Platelets: 237 10*3/uL (ref 140–400)
RBC: 4.5 10*6/uL (ref 4.20–5.80)
RDW: 12.6 % (ref 11.0–15.0)
WBC: 8 10*3/uL (ref 3.8–10.8)

## 2019-09-13 LAB — HEMOGLOBIN A1C
Hgb A1c MFr Bld: 5.7 % of total Hgb — ABNORMAL HIGH (ref <5.7)
Mean Plasma Glucose: 117 (calc)
eAG (mmol/L): 6.5 (calc)

## 2019-09-13 LAB — LIPID PANEL
Cholesterol: 112 mg/dL (ref <200)
HDL: 32 mg/dL — ABNORMAL LOW (ref >=40)
LDL Cholesterol (Calc): 49 mg/dL (calc)
Non-HDL Cholesterol (Calc): 80 mg/dL (calc) (ref <130)
Total CHOL/HDL Ratio: 3.5 (calc) (ref <5.0)
Triglycerides: 247 mg/dL — ABNORMAL HIGH (ref <150)

## 2019-09-13 LAB — TSH: TSH: 0.27 mIU/L — ABNORMAL LOW (ref 0.40–4.50)

## 2019-09-28 DIAGNOSIS — G4733 Obstructive sleep apnea (adult) (pediatric): Secondary | ICD-10-CM | POA: Diagnosis not present

## 2019-10-29 DIAGNOSIS — G4733 Obstructive sleep apnea (adult) (pediatric): Secondary | ICD-10-CM | POA: Diagnosis not present

## 2019-11-28 DIAGNOSIS — G4733 Obstructive sleep apnea (adult) (pediatric): Secondary | ICD-10-CM | POA: Diagnosis not present

## 2019-12-06 DIAGNOSIS — G4733 Obstructive sleep apnea (adult) (pediatric): Secondary | ICD-10-CM | POA: Diagnosis not present

## 2019-12-16 DIAGNOSIS — Z5189 Encounter for other specified aftercare: Secondary | ICD-10-CM | POA: Insufficient documentation

## 2019-12-16 DIAGNOSIS — E7800 Pure hypercholesterolemia, unspecified: Secondary | ICD-10-CM | POA: Insufficient documentation

## 2019-12-16 DIAGNOSIS — I10 Essential (primary) hypertension: Secondary | ICD-10-CM | POA: Insufficient documentation

## 2019-12-20 ENCOUNTER — Ambulatory Visit: Payer: BC Managed Care – PPO | Admitting: Cardiology

## 2019-12-20 ENCOUNTER — Encounter: Payer: Self-pay | Admitting: Cardiology

## 2019-12-20 ENCOUNTER — Other Ambulatory Visit: Payer: Self-pay

## 2019-12-20 VITALS — BP 142/76 | HR 84 | Ht 78.0 in | Wt 314.0 lb

## 2019-12-20 DIAGNOSIS — E7800 Pure hypercholesterolemia, unspecified: Secondary | ICD-10-CM

## 2019-12-20 DIAGNOSIS — I480 Paroxysmal atrial fibrillation: Secondary | ICD-10-CM

## 2019-12-20 DIAGNOSIS — I10 Essential (primary) hypertension: Secondary | ICD-10-CM | POA: Diagnosis not present

## 2019-12-20 DIAGNOSIS — G4733 Obstructive sleep apnea (adult) (pediatric): Secondary | ICD-10-CM

## 2019-12-20 MED ORDER — ROSUVASTATIN CALCIUM 10 MG PO TABS
10.0000 mg | ORAL_TABLET | Freq: Every day | ORAL | 3 refills | Status: DC
Start: 2019-12-20 — End: 2020-11-26

## 2019-12-20 MED ORDER — RIVAROXABAN 20 MG PO TABS
20.0000 mg | ORAL_TABLET | Freq: Every day | ORAL | 3 refills | Status: AC
Start: 2019-12-20 — End: ?

## 2019-12-20 NOTE — Progress Notes (Signed)
Cardiology Office Note:    Date:  12/20/2019   ID:  Dylan Vaughn, DOB October 06, 1970, MRN [id number]  PCP:  Emeterio Reeve, DO  Cardiologist:  Jenne Campus, MD    Referring MD: Emeterio Reeve, DO   No chief complaint on file. I had 1 episode of atrial fibrillation  History of Present Illness:    Dylan Vaughn is a 49 y.o. male with past medical history significant for paroxysmal atrial fibrillation, his chads 2 Vascor is only 1 secondary to hypertension, essential hypertension, dyslipidemia, high triglyceride, obstructive sleep apnea comes today 2 months for follow-up overall he is doing great he exercised on the regular basis he walks he goes to gym 3 times a week and participate in different classes he is disappointed because he did not lose any weight.  He also used CPAP mask on the regular basis but he cannot tell me if it helps him.  He described at least 1 documented episode of another atrial fibrillation.  He does have apple watch and recorded atrial fibrillation.  He was fine during the time he went and played golf episode lasted for about 20 hours no shortness of breath no sweating no chest pain.  Converted spontaneously.  Past Medical History:  Diagnosis Date  . Blood transfusion without reported diagnosis    2000 with colectomy   . Diverticular disease 04/25/2018  . Diverticulitis of colon with bleeding 2000  . Elevated hemoglobin A1c measurement 04/30/2018  . Essential hypertension 04/25/2018  . High blood pressure   . High cholesterol   . History of thyroid disorder 04/25/2018   S/p biopsy WNL  . Hypertriglyceridemia 08/21/2018  . Impaired glucose tolerance 05/22/2018   Per record review, fasting Glc elevated but A1C 12/2016 was 5.5  . Mixed hyperlipidemia 04/25/2018  . Obstructive sleep apnea 02/18/2019  . Paroxysmal atrial fibrillation ([place]) 02/18/2019    Past Surgical History:  Procedure Laterality Date  . COLECTOMY  2000   Ruptured diverticulum- ascending and  transverse colon removed   . COLONOSCOPY  2000   ruptured diverticulum   . EYE SURGERY     1978/1988  . INGUINAL HERNIA REPAIR  1976  . POSTERIOR CRUCIATE LIGAMENT RECONSTRUCTION  1991  . UPPER GASTROINTESTINAL ENDOSCOPY     2000    Current Medications: Current Meds  Medication Sig  . aspirin EC 81 MG tablet Take 81 mg by mouth daily.  Marland Kitchen ibuprofen (ADVIL) 200 MG tablet Take 600-800 mg by mouth every 6 (six) hours as needed for moderate pain.  Marland Kitchen lisinopril-hydrochlorothiazide (ZESTORETIC) 20-25 MG tablet TAKE 1 TABLET DAILY  . metoprolol succinate (TOPROL-XL) 50 MG 24 hr tablet Take 1 tablet (50 mg total) by mouth daily. Take with or immediately following a meal.  . niacin (NIASPAN) 1000 MG CR tablet TAKE 1 TABLET AT BEDTIME  . OVER THE COUNTER MEDICATION Take 1 tablet by mouth daily. Taking 1 tab of each version of Juice Plus vitamins  . psyllium (METAMUCIL) 58.6 % packet Take 1 packet by mouth daily.  . rosuvastatin (CRESTOR) 20 MG tablet TAKE 1 TABLET AT BEDTIME     Allergies:   Belladonna alkaloids   Social History   Socioeconomic History  . Marital status: Married    Spouse name: Not on file  . Number of children: Not on file  . Years of education: Not on file  . Highest education level: Not on file  Occupational History    Employer: [employer]  Tobacco Use  . Smoking status:  Never Smoker  . Smokeless tobacco: Never Used  Vaping Use  . Vaping Use: Never used  Substance and Sexual Activity  . Alcohol use: Yes    Alcohol/week: 6.0 standard drinks    Types: 6 Standard drinks or equivalent per week    Comment: socially   . Drug use: Never  . Sexual activity: Yes    Birth control/protection: Other-see comments    Comment: Vasectomy  Other Topics Concern  . Not on file  Social History Narrative  . Not on file   Social Determinants of Health   Financial Resource Strain:   . Difficulty of Paying Living Expenses: Not on file  Food Insecurity:   . Worried  About Charity fundraiser in the Last Year: Not on file  . Ran Out of Food in the Last Year: Not on file  Transportation Needs:   . Lack of Transportation (Medical): Not on file  . Lack of Transportation (Non-Medical): Not on file  Physical Activity:   . Days of Exercise per Week: Not on file  . Minutes of Exercise per Session: Not on file  Stress:   . Feeling of Stress : Not on file  Social Connections:   . Frequency of Communication with Friends and Family: Not on file  . Frequency of Social Gatherings with Friends and Family: Not on file  . Attends Religious Services: Not on file  . Active Member of Clubs or Organizations: Not on file  . Attends Archivist Meetings: Not on file  . Marital Status: Not on file     Family History: The patient's family history includes Colon polyps in his sister; Heart attack in his father; High blood pressure in his father. There is no history of Colon cancer, Esophageal cancer, Rectal cancer, or Stomach cancer. ROS:   Please see the history of present illness.    All 14 point review of systems negative except as described per history of present illness  EKGs/Labs/Other Studies Reviewed:      Recent Labs: 09/12/2019: ALT 23; BUN 10; Creat 0.75; Hemoglobin 13.3; Platelets 237; Potassium 3.6; Sodium 137; TSH 0.27  Recent Lipid Panel    Component Value Date/Time   CHOL 112 09/12/2019 0703   TRIG 247 (H) 09/12/2019 0703   HDL 32 (L) 09/12/2019 0703   CHOLHDL 3.5 09/12/2019 0703   LDLCALC 49 09/12/2019 0703    Physical Exam:    VS:  BP (!) 142/76   Pulse 84   Ht 6\' 6"  (1.981 m)   Wt (!) 314 lb 0.6 oz (142.4 kg)   SpO2 95%   BMI 36.29 kg/m     Wt Readings from Last 3 Encounters:  12/20/19 (!) 314 lb 0.6 oz (142.4 kg)  07/27/19 300 lb (136.1 kg)  07/17/19 (!) 303 lb 1.3 oz (137.5 kg)     GEN:  Well nourished, well developed in no acute distress HEENT: Normal NECK: No JVD; No carotid bruits LYMPHATICS: No  lymphadenopathy CARDIAC: RRR, no murmurs, no rubs, no gallops RESPIRATORY:  Clear to auscultation without rales, wheezing or rhonchi  ABDOMEN: Soft, non-tender, non-distended MUSCULOSKELETAL:  No edema; No deformity  SKIN: Warm and dry LOWER EXTREMITIES: no swelling NEUROLOGIC:  Alert and oriented x 3 PSYCHIATRIC:  Normal affect   ASSESSMENT:    1. Paroxysmal atrial fibrillation (HCC)   2. Primary hypertension   3. Obstructive sleep apnea   4. High cholesterol    PLAN:    In order of problems listed above:  1. Paroxysmal atrial fibrillation I think we are reaching the point that either antiarrhythmic or ablation need to be considered. 2. I presented him there was 2 options but he prefers not to do it yet.  He prefers to continue present management.  I will send him however prescription for Xarelto with instruction to start taking Xarelto 20 mg daily if he had episode of atrial fibrillation lasting more than 24 hours, and asked him to let us know if it happens. 3. Essential hypertension uncontrolled today but he did not take his medication today.  Of course I told him that this is important to take the medication on the regular basis. 4. Dyslipidemia I did review his K PN which showed he is LDL of 49 HDL 32.  This is from 09/12/2019 I asked him to cut down his Crestor to 10 mg daily.   Medication Adjustments/Labs and Tests Ordered: Current medicines are reviewed at length with the patient today.  Concerns regarding medicines are outlined above.  No orders of the defined types were placed in this encounter.  Medication changes: No orders of the defined types were placed in this encounter.   Signed, Park Liter, MD, [facility] 12/20/2019 8:33 AM    [place]

## 2019-12-20 NOTE — Patient Instructions (Addendum)
Medication Instructions:  Your physician has recommended you make the following change in your medication:  START: Xarelto 20 mg take one tablet by mouth daily. Start this if you have an episode of atrial fibrillation that lasts longer than 24 hours.  DECREASE: Crestor 10 mg take one tablet by mouth daily.  *If you need a refill on your cardiac medications before your next appointment, please call your pharmacy*   Lab Work: None If you have labs (blood work) drawn today and your tests are completely normal, you will receive your results only by:  [place] (if you have MyChart) OR  A paper copy in the mail If you have any lab test that is abnormal or we need to change your treatment, we will call you to review the results.   Testing/Procedures: None   Follow-Up: At [facility], you and your health needs are our priority.  As part of our continuing mission to provide you with exceptional heart care, we have created designated Provider Care Teams.  These Care Teams include your primary Cardiologist (physician) and Advanced Practice Providers (APPs -  Physician Assistants and Nurse Practitioners) who all work together to provide you with the care you need, when you need it.  We recommend signing up for the patient portal called "MyChart".  Sign up information is provided on this After Visit Summary.  MyChart is used to connect with patients for Virtual Visits (Telemedicine).  Patients are able to view lab/test results, encounter notes, upcoming appointments, etc.  Non-urgent messages can be sent to your provider as well.   To learn more about what you can do with MyChart, go to [email or web address].    Your next appointment:   5 month(s)  The format for your next appointment:   In Person  Provider:   Jenne Campus, MD   Other Instructions

## 2019-12-20 NOTE — Addendum Note (Signed)
Addended by: Resa Miner I on: 12/20/2019 08:39 AM   Modules accepted: Orders

## 2019-12-29 DIAGNOSIS — G4733 Obstructive sleep apnea (adult) (pediatric): Secondary | ICD-10-CM | POA: Diagnosis not present

## 2020-01-28 DIAGNOSIS — G4733 Obstructive sleep apnea (adult) (pediatric): Secondary | ICD-10-CM | POA: Diagnosis not present

## 2020-02-21 ENCOUNTER — Other Ambulatory Visit: Payer: Self-pay | Admitting: Cardiology

## 2020-02-28 DIAGNOSIS — G4733 Obstructive sleep apnea (adult) (pediatric): Secondary | ICD-10-CM | POA: Diagnosis not present

## 2020-03-30 DIAGNOSIS — G4733 Obstructive sleep apnea (adult) (pediatric): Secondary | ICD-10-CM | POA: Diagnosis not present

## 2020-04-27 DIAGNOSIS — G4733 Obstructive sleep apnea (adult) (pediatric): Secondary | ICD-10-CM | POA: Diagnosis not present

## 2020-05-28 DIAGNOSIS — G4733 Obstructive sleep apnea (adult) (pediatric): Secondary | ICD-10-CM | POA: Diagnosis not present

## 2020-06-08 DIAGNOSIS — G4733 Obstructive sleep apnea (adult) (pediatric): Secondary | ICD-10-CM | POA: Diagnosis not present

## 2020-06-27 DIAGNOSIS — G4733 Obstructive sleep apnea (adult) (pediatric): Secondary | ICD-10-CM | POA: Diagnosis not present

## 2020-07-24 ENCOUNTER — Other Ambulatory Visit: Payer: Self-pay | Admitting: Osteopathic Medicine

## 2020-07-28 DIAGNOSIS — G4733 Obstructive sleep apnea (adult) (pediatric): Secondary | ICD-10-CM | POA: Diagnosis not present

## 2020-08-27 DIAGNOSIS — G4733 Obstructive sleep apnea (adult) (pediatric): Secondary | ICD-10-CM | POA: Diagnosis not present

## 2020-09-23 ENCOUNTER — Encounter: Payer: Self-pay | Admitting: Osteopathic Medicine

## 2020-09-24 MED ORDER — AMBULATORY NON FORMULARY MEDICATION: 99 refills | Status: AC

## 2020-09-24 NOTE — Telephone Encounter (Signed)
Sleep study done 2021 See Rx - I don't think i've ever ordered a portable CPAP - may need home health to guide [place] on this one.

## 2020-09-25 DIAGNOSIS — G4733 Obstructive sleep apnea (adult) (pediatric): Secondary | ICD-10-CM | POA: Diagnosis not present

## 2020-09-27 DIAGNOSIS — G4733 Obstructive sleep apnea (adult) (pediatric): Secondary | ICD-10-CM | POA: Diagnosis not present

## 2020-09-29 ENCOUNTER — Other Ambulatory Visit: Payer: Self-pay

## 2020-09-29 ENCOUNTER — Encounter: Payer: Self-pay | Admitting: Cardiology

## 2020-09-29 ENCOUNTER — Ambulatory Visit: Payer: BC Managed Care – PPO | Admitting: Cardiology

## 2020-09-29 VITALS — BP 122/72 | HR 79 | Ht 78.0 in | Wt 319.0 lb

## 2020-09-29 DIAGNOSIS — G4733 Obstructive sleep apnea (adult) (pediatric): Secondary | ICD-10-CM | POA: Diagnosis not present

## 2020-09-29 DIAGNOSIS — E7800 Pure hypercholesterolemia, unspecified: Secondary | ICD-10-CM

## 2020-09-29 DIAGNOSIS — I10 Essential (primary) hypertension: Secondary | ICD-10-CM

## 2020-09-29 DIAGNOSIS — I480 Paroxysmal atrial fibrillation: Secondary | ICD-10-CM | POA: Diagnosis not present

## 2020-09-29 NOTE — Patient Instructions (Signed)
Medication Instructions:  Your physician recommends that you continue on your current medications as directed. Please refer to the Current Medication list given to you today.  *If you need a refill on your cardiac medications before your next appointment, please call your pharmacy*   Lab Work: None If you have labs (blood work) drawn today and your tests are completely normal, you will receive your results only by: [place] (if you have MyChart) OR A paper copy in the mail If you have any lab test that is abnormal or we need to change your treatment, we will call you to review the results.   Testing/Procedures: Your physician has requested that you have an echocardiogram. Echocardiography is a painless test that uses sound waves to create images of your heart. It provides your doctor with information about the size and shape of your heart and how well your heart's chambers and valves are working. This procedure takes approximately one hour. There are no restrictions for this procedure.    Follow-Up: At [facility], you and your health needs are our priority.  As part of our continuing mission to provide you with exceptional heart care, we have created designated Provider Care Teams.  These Care Teams include your primary Cardiologist (physician) and Advanced Practice Providers (APPs -  Physician Assistants and Nurse Practitioners) who all work together to provide you with the care you need, when you need it.  We recommend signing up for the patient portal called "MyChart".  Sign up information is provided on this After Visit Summary.  MyChart is used to connect with patients for Virtual Visits (Telemedicine).  Patients are able to view lab/test results, encounter notes, upcoming appointments, etc.  Non-urgent messages can be sent to your provider as well.   To learn more about what you can do with MyChart, go to [email or web address].    Your next appointment:   6  month(s)  The format for your next appointment:   In Person  Provider:   Jenne Campus, MD   Other Instructions Echocardiogram An echocardiogram is a test that uses sound waves (ultrasound) to produce images of the heart. Images from an echocardiogram can provide important information about: Heart size and shape. The size and thickness and movement of your heart's walls. Heart muscle function and strength. Heart valve function or if you have stenosis. Stenosis is when the heart valves are too narrow. If blood is flowing backward through the heart valves (regurgitation). A tumor or infectious growth around the heart valves. Areas of heart muscle that are not working well because of poor blood flow or injury from a heart attack. Aneurysm detection. An aneurysm is a weak or damaged part of an artery wall. The wall bulges out from the normal force of blood pumping through the body. Tell a health care provider about: Any allergies you have. All medicines you are taking, including vitamins, herbs, eye drops, creams, and over-the-counter medicines. Any blood disorders you have. Any surgeries you have had. Any medical conditions you have. Whether you are pregnant or may be pregnant. What are the risks? Generally, this is a safe test. However, problems may occur, including an allergic reaction to dye (contrast) that may be used during the test. What happens before the test? No specific preparation is needed. You may eat and drink normally. What happens during the test?  You will take off your clothes from the waist up and put on a hospital gown. Electrodes or electrocardiogram (ECG)patches may be placed on  your chest. The electrodes or patches are then connected to a device that monitors your heart rate and rhythm. You will lie down on a table for an ultrasound exam. A gel will be applied to your chest to help sound waves pass through your skin. A handheld device, called a transducer,  will be pressed against your chest and moved over your heart. The transducer produces sound waves that travel to your heart and bounce back (or "echo" back) to the transducer. These sound waves will be captured in real-time and changed into images of your heart that can be viewed on a video monitor. The images will be recorded on a computer and reviewed by your health care provider. You may be asked to change positions or hold your breath for a short time. This makes it easier to get different views or better views of your heart. In some cases, you may receive contrast through an IV in one of your veins. This can improve the quality of the pictures from your heart. The procedure may vary among health care providers and hospitals. What can I expect after the test? You may return to your normal, everyday life, including diet, activities, andmedicines, unless your health care provider tells you not to do that. Follow these instructions at home: It is up to you to get the results of your test. Ask your health care provider, or the department that is doing the test, when your results will be ready. Keep all follow-up visits. This is important. Summary An echocardiogram is a test that uses sound waves (ultrasound) to produce images of the heart. Images from an echocardiogram can provide important information about the size and shape of your heart, heart muscle function, heart valve function, and other possible heart problems. You do not need to do anything to prepare before this test. You may eat and drink normally. After the echocardiogram is completed, you may return to your normal, everyday life, unless your health care provider tells you not to do that. This information is not intended to replace advice given to you by your health care provider. Make sure you discuss any questions you have with your healthcare provider. Document Revised: 10/01/2019 Document Reviewed: 10/01/2019 Elsevier Patient  Education  2022 [employer].

## 2020-09-29 NOTE — Progress Notes (Signed)
EKG

## 2020-09-29 NOTE — Addendum Note (Signed)
Addended by: Orvan July on: 09/29/2020 03:29 PM   Modules accepted: Orders

## 2020-09-29 NOTE — Progress Notes (Signed)
Cardiology Office Note:    Date:  09/29/2020   ID:  Dylan Vaughn, DOB 1970-12-22, MRN [id number]  PCP:  Dylan Reeve, DO  Cardiologist:  Dylan Campus, MD    Referring MD: Dylan Reeve, DO   Chief Complaint  Patient presents with   Winded while sleeping     History of Present Illness:    Dylan Vaughn is a 50 y.o. male with past medical history significant for morbid obesity, impaired glucose tolerance, dyslipidemia, obstructive sleep apnea, paroxysmal atrial fibrillation.  Since have seen him last time he did not have any episode of atrial fibrillation.  He described however 1 time for 2 days he could not lay flat and breathe at night.  He did not notice any weight gain at that time.  He did not notice any palpitation.  EKG recorded at that time by his apple watch did not show atrial fibrillation.  Otherwise he is trying to work on his weight he is trying to be a little more active there was some change in his drop to prevent him from keep up with exercise routine.  That he committed to lose weight  Past Medical History:  Diagnosis Date   Blood transfusion without reported diagnosis    2000 with colectomy    Diverticular disease 04/25/2018   Diverticulitis of colon with bleeding 2000   Elevated hemoglobin A1c measurement 04/30/2018   Essential hypertension 04/25/2018   High blood pressure    High cholesterol    History of thyroid disorder 04/25/2018   S/p biopsy WNL   Hypertriglyceridemia 08/21/2018   Impaired glucose tolerance 05/22/2018   Per record review, fasting Glc elevated but A1C 12/2016 was 5.5   Mixed hyperlipidemia 04/25/2018   Obstructive sleep apnea 02/18/2019   Paroxysmal atrial fibrillation ([place]) 02/18/2019    Past Surgical History:  Procedure Laterality Date   COLECTOMY  2000   Ruptured diverticulum- ascending and transverse colon removed    COLONOSCOPY  2000   ruptured diverticulum    EYE SURGERY     1978/1988   INGUINAL HERNIA REPAIR  1976    POSTERIOR CRUCIATE LIGAMENT RECONSTRUCTION  1991   UPPER GASTROINTESTINAL ENDOSCOPY     2000    Current Medications: Current Meds  Medication Sig   AMBULATORY NON FORMULARY MEDICATION Supply ordered: PORTABLE CPAP and other supplies needed (headgear, cushions, filters, heated tuubing and water chamber) Dx: obstructive sleep apnea Settings: auto-titration 5-20 cmH2O (Patient taking differently: 1 application by Other route as directed. Supply ordered: PORTABLE CPAP and other supplies needed (headgear, cushions, filters, heated tuubing and water chamber) Dx: obstructive sleep apnea Settings: auto-titration 5-20 cmH2O)   aspirin EC 81 MG tablet Take 81 mg by mouth daily.   ibuprofen (ADVIL) 200 MG tablet Take 600-800 mg by mouth every 6 (six) hours as needed for moderate pain.   lisinopril-hydrochlorothiazide (ZESTORETIC) 20-25 MG tablet TAKE 1 TABLET DAILY (Patient taking differently: Take 1 tablet by mouth daily.)   metoprolol succinate (TOPROL-XL) 50 MG 24 hr tablet TAKE 1 TABLET DAILY WITH OR IMMEDIATELY FOLLOWING A MEAL (Patient taking differently: Take 50 mg by mouth daily.)   niacin (NIASPAN) 1000 MG CR tablet TAKE 1 TABLET AT BEDTIME (Patient taking differently: Take 1,000 mg by mouth at bedtime.)   OVER THE COUNTER MEDICATION Take 1 tablet by mouth daily. Taking 1 tab of each version of Juice Plus vitamins / Unknown strength   psyllium (METAMUCIL) 58.6 % packet Take 1 packet by mouth daily.   rivaroxaban (XARELTO) 20  MG TABS tablet Take 1 tablet (20 mg total) by mouth daily with supper. (Patient taking differently: Take 20 mg by mouth as needed (if AFIB events last more than 24hrs).)   rosuvastatin (CRESTOR) 10 MG tablet Take 1 tablet (10 mg total) by mouth daily.     Allergies:   Belladonna alkaloids   Social History   Socioeconomic History   Marital status: Married    Spouse name: Not on file   Number of children: Not on file   Years of education: Not on file   Highest  education level: Not on file  Occupational History    Employer: [employer]  Tobacco Use   Smoking status: Never   Smokeless tobacco: Never  Vaping Use   Vaping Use: Never used  Substance and Sexual Activity   Alcohol use: Yes    Alcohol/week: 6.0 standard drinks    Types: 6 Standard drinks or equivalent per week    Comment: socially    Drug use: Never   Sexual activity: Yes    Birth control/protection: Other-see comments    Comment: Vasectomy  Other Topics Concern   Not on file  Social History Narrative   Not on file   Social Determinants of Health   Financial Resource Strain: Not on file  Food Insecurity: Not on file  Transportation Needs: Not on file  Physical Activity: Not on file  Stress: Not on file  Social Connections: Not on file     Family History: The patient's family history includes Colon polyps in his sister; Heart attack in his father; High blood pressure in his father. There is no history of Colon cancer, Esophageal cancer, Rectal cancer, or Stomach cancer. ROS:   Please see the history of present illness.    All 14 point review of systems negative except as described per history of present illness  EKGs/Labs/Other Studies Reviewed:      Recent Labs: No results found for requested labs within last 8760 hours.  Recent Lipid Panel    Component Value Date/Time   CHOL 112 09/12/2019 0703   TRIG 247 (H) 09/12/2019 0703   HDL 32 (L) 09/12/2019 0703   CHOLHDL 3.5 09/12/2019 0703   LDLCALC 49 09/12/2019 0703    Physical Exam:    VS:  BP 122/72 (BP Location: Right Arm, Patient Position: Sitting)   Pulse 79   Ht '6\' 6"'$  (1.981 m)   Wt (!) 319 lb (144.7 kg)   SpO2 96%   BMI 36.86 kg/m     Wt Readings from Last 3 Encounters:  09/29/20 (!) 319 lb (144.7 kg)  12/20/19 (!) 314 lb 0.6 oz (142.4 kg)  07/27/19 300 lb (136.1 kg)     GEN:  Well nourished, well developed in no acute distress HEENT: Normal NECK: No JVD; No carotid bruits LYMPHATICS:  No lymphadenopathy CARDIAC: RRR, no murmurs, no rubs, no gallops RESPIRATORY:  Clear to auscultation without rales, wheezing or rhonchi  ABDOMEN: Soft, non-tender, non-distended MUSCULOSKELETAL:  No edema; No deformity  SKIN: Warm and dry LOWER EXTREMITIES: no swelling NEUROLOGIC:  Alert and oriented x 3 PSYCHIATRIC:  Normal affect   ASSESSMENT:    1. Paroxysmal atrial fibrillation (HCC)   2. Essential hypertension   3. Obstructive sleep apnea   4. High cholesterol    PLAN:    In order of problems listed above:  Paroxysmal atrial fibrillation his CHADS2 Vascor equals 1.  He is not anticoagulated.  Denies having any episodes since have seen him  last time.  We will continue monitoring. Essential hypertension blood pressure well controlled continue present management. Dyslipidemia I did review K PN which show LDL of 49 HDL 32, apparently he did have recently cholesterol profile done again we will try to get a copy of it. Obstructive sleep apnea on CPAP mask. Episode shortness of breath at night look like paroxysmal nocturnal dyspnea.  I will ask him to have an echocardiogram done to assess left ventricular ejection fraction   Medication Adjustments/Labs and Tests Ordered: Current medicines are reviewed at length with the patient today.  Concerns regarding medicines are outlined above.  No orders of the defined types were placed in this encounter.  Medication changes: No orders of the defined types were placed in this encounter.   Signed, Park Liter, MD, [facility] 09/29/2020 3:08 PM    [facility]

## 2020-10-22 ENCOUNTER — Other Ambulatory Visit: Payer: Self-pay | Admitting: Osteopathic Medicine

## 2020-10-28 ENCOUNTER — Other Ambulatory Visit (HOSPITAL_COMMUNITY): Payer: BC Managed Care – PPO

## 2020-10-28 ENCOUNTER — Telehealth (HOSPITAL_COMMUNITY): Payer: Self-pay | Admitting: Cardiology

## 2020-10-28 DIAGNOSIS — G4733 Obstructive sleep apnea (adult) (pediatric): Secondary | ICD-10-CM | POA: Diagnosis not present

## 2020-10-28 NOTE — Telephone Encounter (Signed)
Patient called on 10/27/20 @ 5:58 pm to cancel echocardiogram scheduled for 10/28/20 due to patient and wife sick with COVID.  Pt left voicemail on the Admin Asst. VM line.  Admin Asst. does not arrive til 7:30.  Patient was NO SHOWED for echocardiogram.  Please note that patient was not a NO SHOW and Inbasket message was sent to Echo Supervisor to see if it could be removed from the chart. Thank you.

## 2020-11-26 ENCOUNTER — Other Ambulatory Visit: Payer: Self-pay | Admitting: Cardiology

## 2020-11-26 NOTE — Telephone Encounter (Signed)
Rosuvastatin 10 mg tablets  # 90 x 3 refills sent to  [place], [place]

## 2020-11-27 DIAGNOSIS — G4733 Obstructive sleep apnea (adult) (pediatric): Secondary | ICD-10-CM | POA: Diagnosis not present

## 2020-12-01 IMAGING — CT CT RENAL STONE PROTOCOL
2 of 4 series · 16 of 46 positions shown, 18 images · non-contrast
Comparison: None.

CLINICAL DATA: Flank pain and difficulty urinating

EXAM:
CT ABDOMEN AND PELVIS WITHOUT CONTRAST
TECHNIQUE: Multidetector CT imaging of the abdomen and pelvis was performed
following the standard protocol without IV contrast.

[Series 3: renal stone 5.0 · axial · 0.98mm/px · z∈[+773,+1308]mm · 13 of 117 slices shown, 15 images]
[im 5/117  soft-tissue]
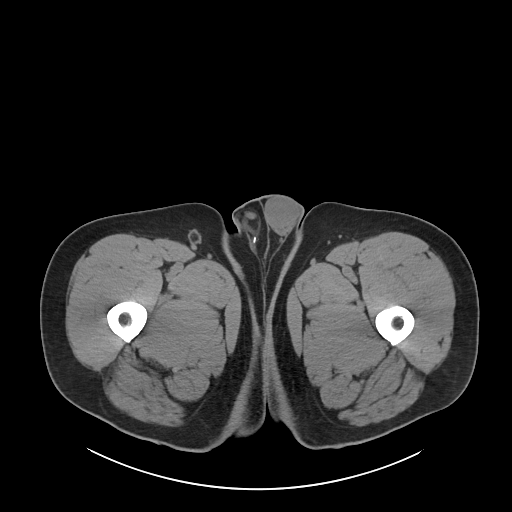
[im 5/117  bone]
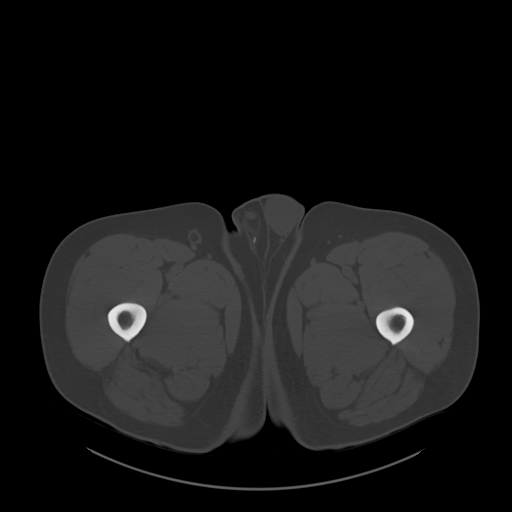
[im 15/117  soft-tissue]
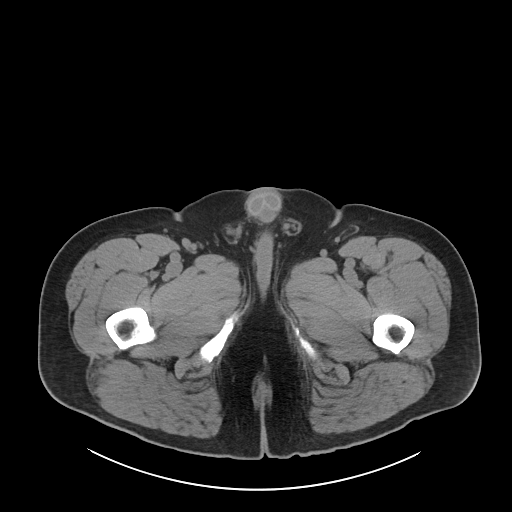
[im 25/117  soft-tissue]
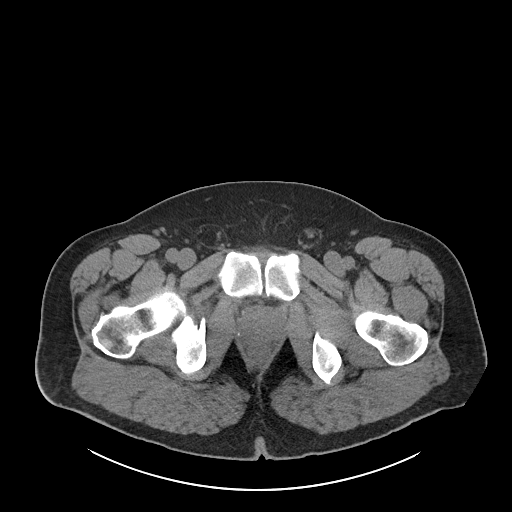
[im 34/117  soft-tissue]
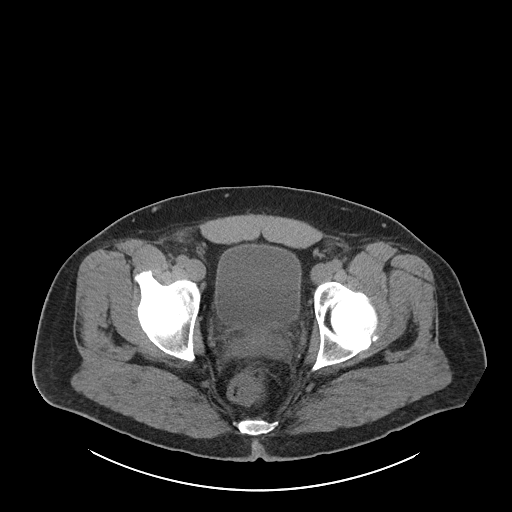
[im 39/117  soft-tissue]
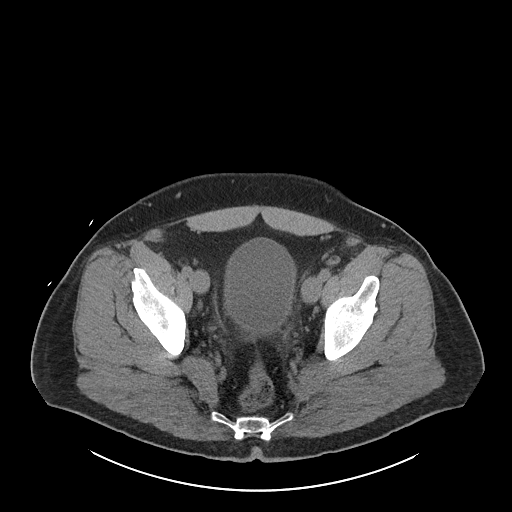
[im 49/117  soft-tissue]
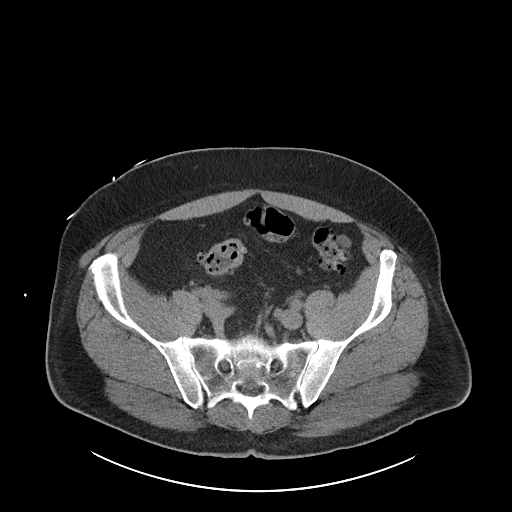
[im 59/117  soft-tissue]
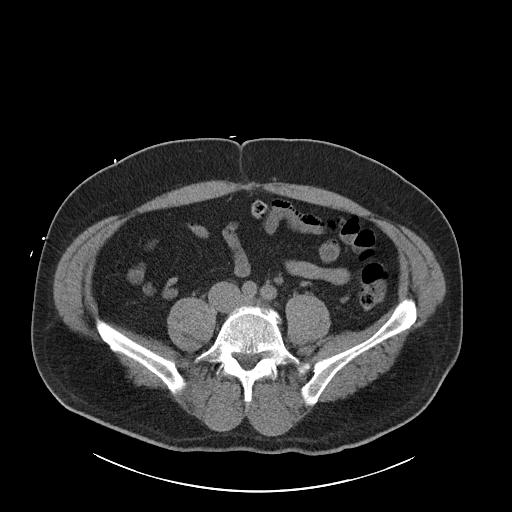
[im 68/117  soft-tissue]
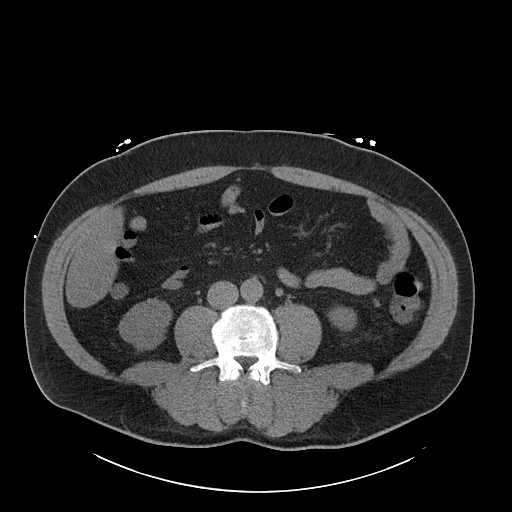
[im 78/117  soft-tissue]
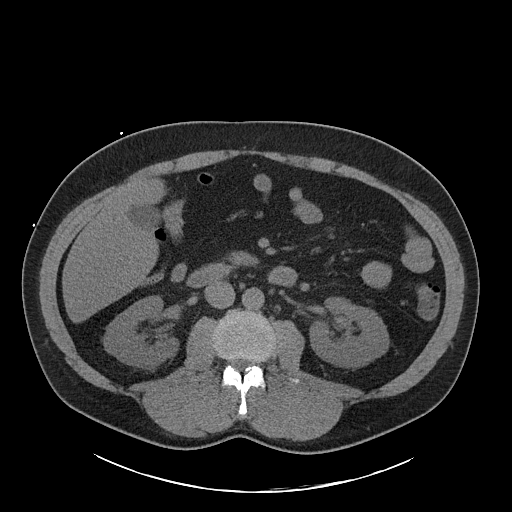
[im 78/117  bone]
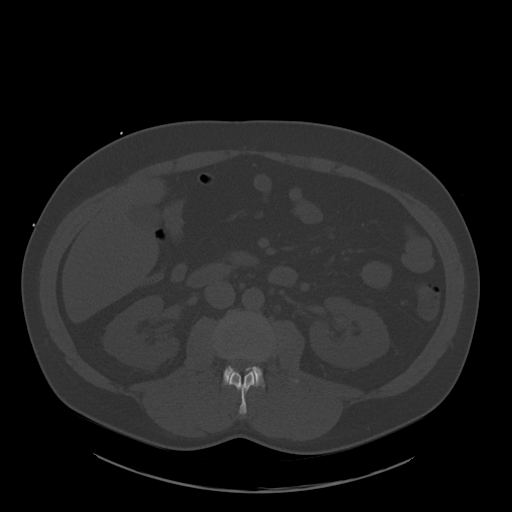
[im 83/117  soft-tissue]
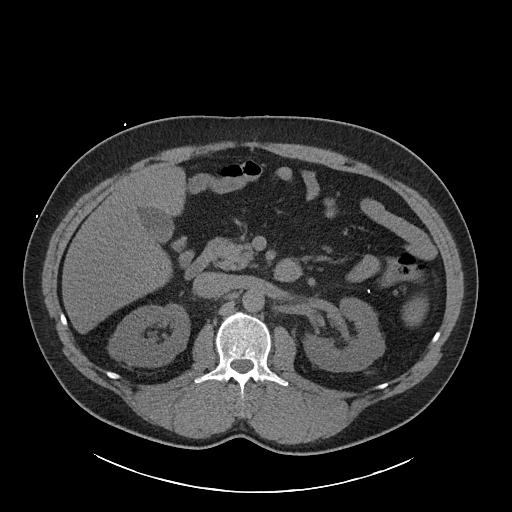
[im 92/117  soft-tissue]
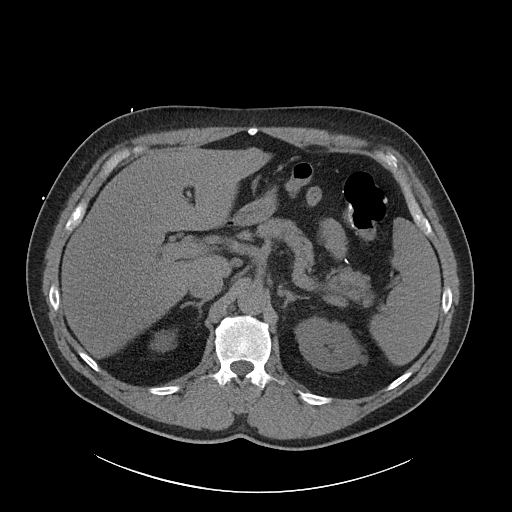
[im 102/117  soft-tissue]
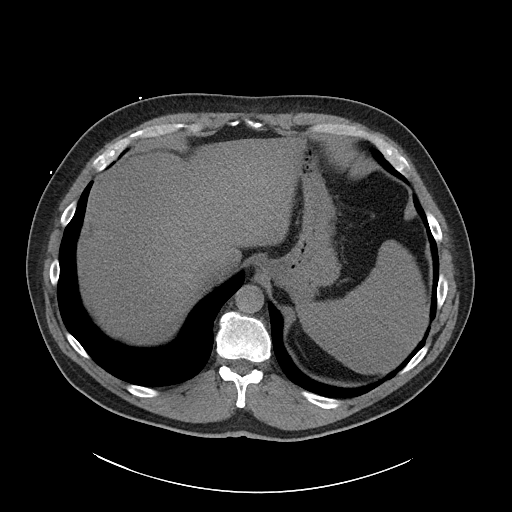
[im 112/117  soft-tissue]
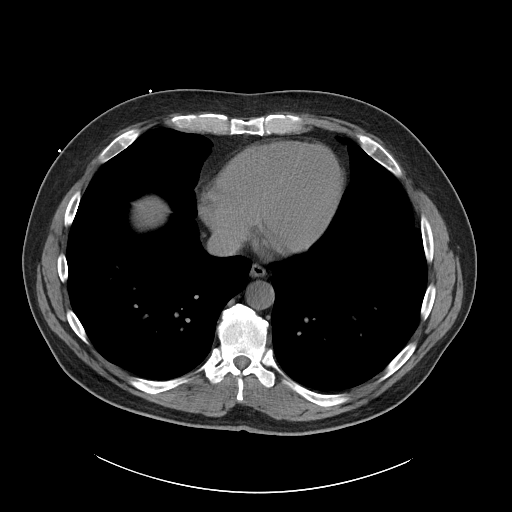

[Series 6: coronal · coronal · 1.04mm/px · 3 of 132 slices shown]
[im 44/132  soft-tissue]
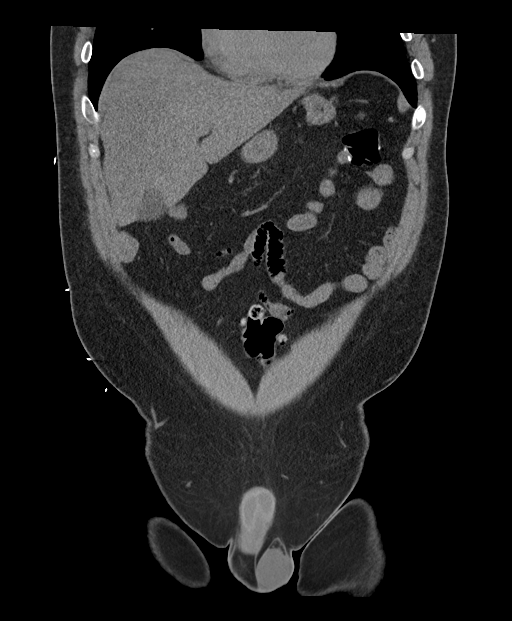
[im 59/132  soft-tissue]
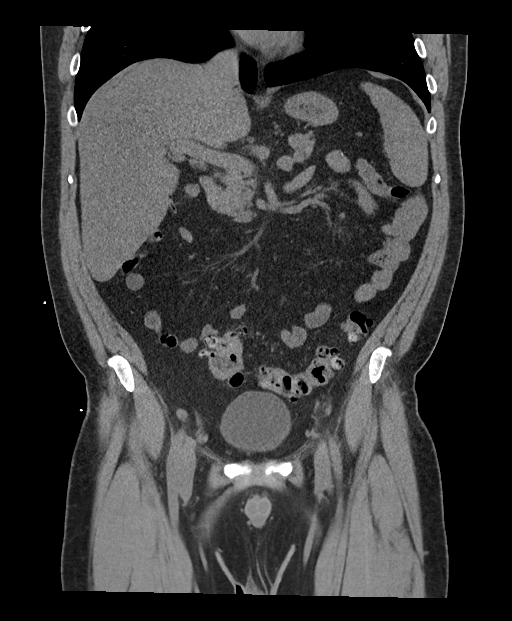
[im 73/132  soft-tissue]
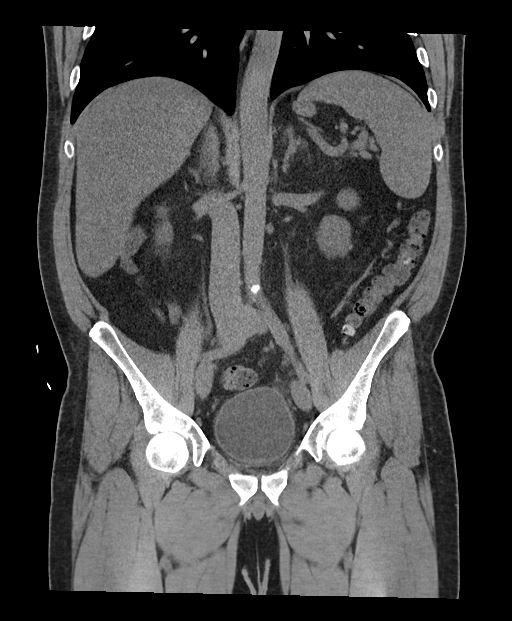

[16 of 46 positions shown; findings below may reference images not displayed]

FINDINGS: Lower chest: No acute abnormality.

Hepatobiliary: Mild fatty infiltration of the liver is noted. A
vague 1 cm hypodensity is noted in the inferior aspect of the right
lobe of the liver likely representing small cyst or hemangioma but
incompletely characterized on this exam. Gallbladder is well
visualized and within normal limits.

Pancreas: Unremarkable. No pancreatic ductal dilatation or
surrounding inflammatory changes.

Spleen: Normal in size without focal abnormality.

Adrenals/Urinary Tract: Adrenal glands are within normal limits.
Kidneys are well visualized bilaterally. No renal calculi or
obstructive changes are noted. Bladder is partially distended. Some
mild inflammatory changes are noted surrounding the bladder which
would be consistent with the given clinical history of UTI.
Possibility of some underlying prostatitis would deserve
consideration as well.

Stomach/Bowel: Diverticular change of the colon is noted without
evidence of diverticulitis. Postsurgical changes are seen consistent
with a partial colectomy on the right. The appendix has been
surgically removed. No obstructive or inflammatory changes are seen.
The stomach is within normal limits.

Vascular/Lymphatic: Aortic atherosclerosis. No enlarged abdominal or
pelvic lymph nodes.

Reproductive: Prostate is well visualized and there is again some
mild inflammatory change surrounding the bladder and prostate.
Possibility of local prostate inflammation deserves consideration.

Other: No abdominal wall hernia or abnormality. No abdominopelvic
ascites.

Musculoskeletal: No acute or significant osseous findings.
IMPRESSION: Mild inflammatory change surrounding the bladder and prostate as
described. This may represent changes of UTI consistent with the
given clinical history. The possibility of some underlying
prostatitis deserves consideration as well.

Diverticulosis without diverticulitis.

Small hypodensity within the liver as described. Nonemergent
ultrasound could be performed for further evaluation.

## 2020-12-28 DIAGNOSIS — G4733 Obstructive sleep apnea (adult) (pediatric): Secondary | ICD-10-CM | POA: Diagnosis not present

## 2021-01-20 ENCOUNTER — Other Ambulatory Visit: Payer: Self-pay | Admitting: Osteopathic Medicine

## 2021-01-27 DIAGNOSIS — G4733 Obstructive sleep apnea (adult) (pediatric): Secondary | ICD-10-CM | POA: Diagnosis not present

## 2021-02-15 ENCOUNTER — Other Ambulatory Visit: Payer: Self-pay | Admitting: Cardiology

## 2021-02-16 NOTE — Telephone Encounter (Signed)
Metoprolol succinate 50 mg # 90 x 3 refills sent to  [place], [place]

## 2021-02-27 DIAGNOSIS — G4733 Obstructive sleep apnea (adult) (pediatric): Secondary | ICD-10-CM | POA: Diagnosis not present

## 2021-03-15 DIAGNOSIS — G4733 Obstructive sleep apnea (adult) (pediatric): Secondary | ICD-10-CM | POA: Diagnosis not present

## 2021-03-30 DIAGNOSIS — G4733 Obstructive sleep apnea (adult) (pediatric): Secondary | ICD-10-CM | POA: Diagnosis not present

## 2021-03-31 DIAGNOSIS — K122 Cellulitis and abscess of mouth: Secondary | ICD-10-CM | POA: Insufficient documentation

## 2021-04-13 ENCOUNTER — Ambulatory Visit: Payer: BC Managed Care – PPO | Admitting: Cardiology

## 2021-04-20 ENCOUNTER — Other Ambulatory Visit: Payer: Self-pay | Admitting: Family Medicine

## 2021-04-30 ENCOUNTER — Encounter: Payer: Self-pay | Admitting: Cardiology

## 2021-04-30 ENCOUNTER — Ambulatory Visit: Payer: BC Managed Care – PPO | Admitting: Cardiology

## 2021-04-30 ENCOUNTER — Other Ambulatory Visit: Payer: Self-pay

## 2021-04-30 VITALS — BP 130/78 | HR 78 | Ht 78.0 in | Wt 320.0 lb

## 2021-04-30 DIAGNOSIS — I480 Paroxysmal atrial fibrillation: Secondary | ICD-10-CM

## 2021-04-30 DIAGNOSIS — E782 Mixed hyperlipidemia: Secondary | ICD-10-CM

## 2021-04-30 DIAGNOSIS — I10 Essential (primary) hypertension: Secondary | ICD-10-CM

## 2021-04-30 DIAGNOSIS — G4733 Obstructive sleep apnea (adult) (pediatric): Secondary | ICD-10-CM | POA: Diagnosis not present

## 2021-04-30 NOTE — Patient Instructions (Addendum)
Medication Instructions:  ?Your physician recommends that you continue on your current medications as directed. Please refer to the Current Medication list given to you today. ? ?*If you need a refill on your cardiac medications before your next appointment, please call your pharmacy* ? ? ?Lab Work: ?None ?If you have labs (blood work) drawn today and your tests are completely normal, you will receive your results only by: ?MyChart Message (if you have MyChart) OR ?A paper copy in the mail ?If you have any lab test that is abnormal or we need to change your treatment, we will call you to review the results. ? ? ?Testing/Procedures: ?None ? ? ?Follow-Up: ?At [facility], you and your health needs are our priority.  As part of our continuing mission to provide you with exceptional heart care, we have created designated Provider Care Teams.  These Care Teams include your primary Cardiologist (physician) and Advanced Practice Providers (APPs -  Physician Assistants and Nurse Practitioners) who all work together to provide you with the care you need, when you need it. ? ?We recommend signing up for the patient portal called "MyChart".  Sign up information is provided on this After Visit Summary.  MyChart is used to connect with patients for Virtual Visits (Telemedicine).  Patients are able to view lab/test results, encounter notes, upcoming appointments, etc.  Non-urgent messages can be sent to your provider as well.   ?To learn more about what you can do with MyChart, go to [email or web address].   ? ?Your next appointment:   ?6 month(s) ? ?The format for your next appointment:   ?In Person ? ?Provider:   ?Jenne Campus, MD  ? ? ?Other Instructions ?Referred for sleep study ? ?

## 2021-04-30 NOTE — Progress Notes (Signed)
?Cardiology Office Note:   ? ?Date:  04/30/2021  ? ?ID:  Dylan Vaughn, DOB 11/01/1970, MRN [id number] ? ?PCP:  Emeterio Reeve, DO  ?Cardiologist:  Jenne Campus, MD   ? ?Referring MD: Emeterio Reeve, DO  ? ?Chief Complaint  ?Patient presents with  ? Medication Management  ?  Xarelto  ? ? ?History of Present Illness:   ? ?Dylan Vaughn is a 51 y.o. male with past medical history significant for paroxysmal atrial fibrillation, first documented episode in 2020, at that time he came to the emergency with episode of atrial fibrillation, he spontaneously converted after that.  He also is morbidly obese, does have significant sleep apnea which is treated with CPAP mask.  He also got essential hypertension.  Does have elevation calcium score of 225 but asymptomatic in terms of coronary artery disease. ?Comes today to my office for follow-up.  Overall he is doing well.  He denies have any chest pain tightness squeezing pressure burning chest no palpitations dizziness swelling of lower extremities.  He is wearing CPAP mask however this is uncomfortable and he tells me straight that he hates this even though he knows it is good for him and needs to continue to use it. ? ?Past Medical History:  ?Diagnosis Date  ? Blood transfusion without reported diagnosis   ? 2000 with colectomy   ? Diverticular disease 04/25/2018  ? Diverticulitis of colon with bleeding 2000  ? Elevated hemoglobin A1c measurement 04/30/2018  ? Essential hypertension 04/25/2018  ? High blood pressure   ? High cholesterol   ? History of thyroid disorder 04/25/2018  ? S/p biopsy WNL  ? Hypertriglyceridemia 08/21/2018  ? Impaired glucose tolerance 05/22/2018  ? Per record review, fasting Glc elevated but A1C 12/2016 was 5.5  ? Mixed hyperlipidemia 04/25/2018  ? Obstructive sleep apnea 02/18/2019  ? Paroxysmal atrial fibrillation ([place]) 02/18/2019  ? ? ?Past Surgical History:  ?Procedure Laterality Date  ? COLECTOMY  2000  ? Ruptured diverticulum- ascending and  transverse colon removed   ? COLONOSCOPY  2000  ? ruptured diverticulum   ? EYE SURGERY    ? 1978/1988  ? [place]  ? POSTERIOR CRUCIATE LIGAMENT RECONSTRUCTION  1991  ? UPPER GASTROINTESTINAL ENDOSCOPY    ? 2000  ? ? ?Current Medications: ?Current Meds  ?Medication Sig  ? AMBULATORY NON FORMULARY MEDICATION Supply ordered: PORTABLE CPAP and other supplies needed (headgear, cushions, filters, heated tuubing and water chamber) ?Dx: obstructive sleep apnea ?Settings: auto-titration 5-20 cmH2O (Patient taking differently: 1 application. by Other route as directed. Supply ordered: PORTABLE CPAP and other supplies needed (headgear, cushions, filters, heated tuubing and water chamber) ?Dx: obstructive sleep apnea ?Settings: auto-titration 5-20 cmH2O)  ? aspirin EC 81 MG tablet Take 81 mg by mouth daily.  ? ibuprofen (ADVIL) 200 MG tablet Take 600-800 mg by mouth every 6 (six) hours as needed for moderate pain.  ? lisinopril-hydrochlorothiazide (ZESTORETIC) 20-25 MG tablet TAKE 1 TABLET DAILY (NO REFILLS, NEEDS APPOINTMENT WITH PRIMARY CARE PHYSICIAN, LAST VISIT WITH PRIMARY CARE WAS OVER A YEAR AGO) (Patient taking differently: Take 1 tablet by mouth daily.)  ? metoprolol succinate (TOPROL-XL) 50 MG 24 hr tablet TAKE 1 TABLET DAILY WITH OR IMMEDIATELY FOLLOWING A MEAL  ? niacin (NIASPAN) 1000 MG CR tablet TAKE 1 TABLET AT BEDTIME (NEED APPOINTMENT WITH PRIMARY CARE PHYSICIAN, LAST VISIT WITH PRIMARY CARE WAS MORE THAN 1 YEAR) (Patient taking differently: Take 1,000 mg by mouth at bedtime.)  ? OVER THE COUNTER  MEDICATION Take 1 tablet by mouth daily. Taking 1 tab of each version of Juice Plus vitamins / Unknown strength  ? psyllium (METAMUCIL) 58.6 % packet Take 1 packet by mouth daily.  ? rosuvastatin (CRESTOR) 10 MG tablet TAKE 1 TABLET DAILY (Patient taking differently: Take 10 mg by mouth daily.)  ?  ? ?Allergies:   Belladonna alkaloids  ? ?Social History  ? ?Socioeconomic History  ? Marital status:  Married  ?  Spouse name: Not on file  ? Number of children: Not on file  ? Years of education: Not on file  ? Highest education level: Not on file  ?Occupational History  ?  Employer: [employer]  ?Tobacco Use  ? Smoking status: Never  ? Smokeless tobacco: Never  ?Vaping Use  ? Vaping Use: Never used  ?Substance and Sexual Activity  ? Alcohol use: Yes  ?  Alcohol/week: 6.0 standard drinks  ?  Types: 6 Standard drinks or equivalent per week  ?  Comment: socially   ? Drug use: Never  ? Sexual activity: Yes  ?  Birth control/protection: Other-see comments  ?  Comment: Vasectomy  ?Other Topics Concern  ? Not on file  ?Social History Narrative  ? Not on file  ? ?Social Determinants of Health  ? ?Financial Resource Strain: Not on file  ?Food Insecurity: Not on file  ?Transportation Needs: Not on file  ?Physical Activity: Not on file  ?Stress: Not on file  ?Social Connections: Not on file  ?  ? ?Family History: ?The patient's family history includes Colon polyps in his sister; Heart attack in his father; High blood pressure in his father. There is no history of Colon cancer, Esophageal cancer, Rectal cancer, or Stomach cancer. ?ROS:   ?Please see the history of present illness.    ?All 14 point review of systems negative except as described per history of present illness ? ?EKGs/Labs/Other Studies Reviewed:   ? ? ? ?Recent Labs: ?No results found for requested labs within last 8760 hours.  ?Recent Lipid Panel ?   ?Component Value Date/Time  ? CHOL 112 09/12/2019 0703  ? TRIG 247 (H) 09/12/2019 0703  ? HDL 32 (L) 09/12/2019 0703  ? CHOLHDL 3.5 09/12/2019 0703  ? LDLCALC 49 09/12/2019 0703  ? ? ?Physical Exam:   ? ?VS:  BP 130/78 (BP Location: Left Arm)   Pulse 78   Ht '6\' 6"'$  (1.981 m)   Wt (!) 320 lb (145.2 kg)   SpO2 97%   BMI 36.98 kg/m?    ? ?Wt Readings from Last 3 Encounters:  ?04/30/21 (!) 320 lb (145.2 kg)  ?09/29/20 (!) 319 lb (144.7 kg)  ?12/20/19 (!) 314 lb 0.6 oz (142.4 kg)  ?  ? ?GEN:  Well nourished,  well developed in no acute distress ?HEENT: Normal ?NECK: No JVD; No carotid bruits ?LYMPHATICS: No lymphadenopathy ?CARDIAC: RRR, no murmurs, no rubs, no gallops ?RESPIRATORY:  Clear to auscultation without rales, wheezing or rhonchi  ?ABDOMEN: Soft, non-tender, non-distended ?MUSCULOSKELETAL:  No edema; No deformity  ?SKIN: Warm and dry ?LOWER EXTREMITIES: no swelling ?NEUROLOGIC:  Alert and oriented x 3 ?PSYCHIATRIC:  Normal affect  ? ?ASSESSMENT:   ? ?1. Paroxysmal atrial fibrillation (HCC)   ?2. Primary hypertension   ?3. Mixed hyperlipidemia   ?4. Obstructive sleep apnea   ? ?PLAN:   ? ?In order of problems listed above: ? ?Paroxysmal atrial fibrillation maintaining sinus rhythm.  His CHA2DS2-VASc equals 1, he is not anticoagulated but he does  have Xarelto, and I told him if he had episode of atrial fibrillation lasting more than 24 hours he need to start taking Xarelto and give [place] a call that he have atrial fibrillation. ?Essential hypertension: Blood pressure well controlled continue present management. ?Mixed dyslipidemia he does have elevated calcium score.  He also got previously some symptoms of lower extremity look like statin and cholesterol emboli.  He is taking Crestor 10 which I will continue.  I did review K PN which show me his LDL 49 HDL 32.  He is also on Niaspan. ?Obstructive sleep apnea.  Stable from that point review.  He uses mask of the regular basis but have a lot of questions that I was not able to answer.  He will be referred to our sleep specialist. ?I encouraged him to be active.  He complained of having some prior with Achilles which slow him down in terms of exercise however encouraged him to do bike riding stationary bike. ? ? ?Medication Adjustments/Labs and Tests Ordered: ?Current medicines are reviewed at length with the patient today.  Concerns regarding medicines are outlined above.  ?No orders of the defined types were placed in this encounter. ? ?Medication changes: No orders  of the defined types were placed in this encounter. ? ? ?Signed, ?Park Liter, MD, [facility] ?04/30/2021 2:24 PM    ?[place] ?

## 2021-05-28 DIAGNOSIS — G4733 Obstructive sleep apnea (adult) (pediatric): Secondary | ICD-10-CM | POA: Diagnosis not present

## 2021-06-27 DIAGNOSIS — G4733 Obstructive sleep apnea (adult) (pediatric): Secondary | ICD-10-CM | POA: Diagnosis not present

## 2021-07-18 DIAGNOSIS — G4733 Obstructive sleep apnea (adult) (pediatric): Secondary | ICD-10-CM | POA: Diagnosis not present

## 2021-08-30 ENCOUNTER — Ambulatory Visit: Payer: BC Managed Care – PPO | Admitting: Family Medicine

## 2021-08-30 ENCOUNTER — Encounter: Payer: Self-pay | Admitting: Family Medicine

## 2021-08-30 VITALS — BP 134/70 | HR 78 | Temp 96.5°F | Ht 78.0 in | Wt 323.2 lb

## 2021-08-30 DIAGNOSIS — Z23 Encounter for immunization: Secondary | ICD-10-CM | POA: Diagnosis not present

## 2021-08-30 DIAGNOSIS — I10 Essential (primary) hypertension: Secondary | ICD-10-CM

## 2021-08-30 DIAGNOSIS — E781 Pure hyperglyceridemia: Secondary | ICD-10-CM

## 2021-08-30 DIAGNOSIS — R7302 Impaired glucose tolerance (oral): Secondary | ICD-10-CM | POA: Diagnosis not present

## 2021-08-30 DIAGNOSIS — Z8639 Personal history of other endocrine, nutritional and metabolic disease: Secondary | ICD-10-CM

## 2021-08-30 DIAGNOSIS — I480 Paroxysmal atrial fibrillation: Secondary | ICD-10-CM | POA: Diagnosis not present

## 2021-08-30 DIAGNOSIS — G4733 Obstructive sleep apnea (adult) (pediatric): Secondary | ICD-10-CM

## 2021-08-30 DIAGNOSIS — M79672 Pain in left foot: Secondary | ICD-10-CM

## 2021-08-30 NOTE — Progress Notes (Signed)
[facility] [address] Dylan Vaughn [place] [address] Dept: [phone] Dept Fax: [phone]  New Patient Office Visit  Subjective:    Patient ID: Dylan Vaughn, male    DOB: 1970/04/10, 51 y.o..   MRN: [id number]  Chief Complaint  Patient presents with   Establish Care    NP- establish care. Also c/o LT achilles pain since November.      History of Present Illness:  Patient is in today to establish care. Dylan Vaughn was born in [place], [place]. He attended college at the [employer], majoring in Public relations account executive. [place] to work for [employer]. Dylan Vaughn has been married for 28 years. He has two sons (22, 98) who still live in [place]. He denies any tobacco ro drug use. He drinks alcohol occasionally on the weekends, but tries to keep this moderated due to his prior history of a. fib.  Dylan Vaughn has a history of essential hypertension. He is managed on Zestoretic 20-25 mg daily. He also takes metoprolol succinate 50 mg daily. This may have been prescribed for his blood pressure as well.  Dylan Vaughn has a history of episodic a. fib. He is not currently on anticoagulation, as he has been in a sinus rhythm for a long time. He does have a prescription for Xarelto available should he have any a. fib that last > 24 hrs. He had been advised to take a daily aspirin, but admits he does not always do this.  Dylan Vaughn has a history of primarily hypertriglyceridemia. He takes niacin CR 1000 mg daily and rosuvastatin 10 mg daily. He does get occasional episodes of flushing.  Dylan Vaughn has a history of sleep apnea. He does use a CPAP machine. It has improved his snoring, though he can't say that it helps him feel more rested.  Dylan Vaughn notes a history of Achilles tendinitis. More recently, he has had an issue with pain over the lateral attachment of the left Achilles tendon, esp. when his foot is even  slightly planta flexed. beyond 90.   Past Medical History: Patient Active Problem List   Diagnosis Date Noted   Uvulitis 03/31/2021   Obstructive sleep apnea 02/18/2019   Paroxysmal atrial fibrillation ([place]) 02/18/2019   Hypertriglyceridemia 08/21/2018   Impaired glucose tolerance 05/22/2018   Essential hypertension 04/25/2018   Mixed hyperlipidemia 04/25/2018   Diverticular disease 04/25/2018   History of thyroid disorder 04/25/2018   Diverticulitis of colon with bleeding 2000   Past Surgical History:  Procedure Laterality Date   COLECTOMY  2000   Ruptured diverticulum- ascending and transverse colon removed    COLONOSCOPY  2000   ruptured diverticulum    EYE SURGERY     1978/1988   INGUINAL HERNIA REPAIR Left 1976   POSTERIOR CRUCIATE LIGAMENT RECONSTRUCTION Left 1991   UPPER GASTROINTESTINAL ENDOSCOPY     2000   Family History  Problem Relation Age of Onset   High blood pressure Father    Heart attack Father 59   Colon polyps Sister    Heart attack Paternal Grandfather 81   Colon cancer Neg Hx    Esophageal cancer Neg Hx    Rectal cancer Neg Hx    Stomach cancer Neg Hx    Outpatient Medications Prior to Visit  Medication Sig Dispense Refill   AMBULATORY NON FORMULARY MEDICATION Supply ordered: PORTABLE CPAP and other supplies needed (headgear, cushions, filters, heated tuubing and water chamber)  Dx: obstructive sleep apnea Settings: auto-titration 5-20 cmH2O (Patient taking differently: 1 application  by Other route as directed. Supply ordered: PORTABLE CPAP and other supplies needed (headgear, cushions, filters, heated tuubing and water chamber) Dx: obstructive sleep apnea Settings: auto-titration 5-20 cmH2O) 1 Units 99   aspirin EC 81 MG tablet Take 81 mg by mouth daily.     ibuprofen (ADVIL) 200 MG tablet Take 600-800 mg by mouth every 6 (six) hours as needed for moderate pain.     lisinopril-hydrochlorothiazide (ZESTORETIC) 20-25 MG tablet TAKE 1 TABLET DAILY  (NO REFILLS, NEEDS APPOINTMENT WITH PRIMARY CARE PHYSICIAN, LAST VISIT WITH PRIMARY CARE WAS OVER A YEAR AGO) (Patient taking differently: Take 1 tablet by mouth daily.) 30 tablet 0   metoprolol succinate (TOPROL-XL) 50 MG 24 hr tablet TAKE 1 TABLET DAILY WITH OR IMMEDIATELY FOLLOWING A MEAL 90 tablet 3   niacin (NIASPAN) 1000 MG CR tablet TAKE 1 TABLET AT BEDTIME (NEED APPOINTMENT WITH PRIMARY CARE PHYSICIAN, LAST VISIT WITH PRIMARY CARE WAS MORE THAN 1 YEAR) (Patient taking differently: Take 1,000 mg by mouth at bedtime.) 30 tablet 0   OVER THE COUNTER MEDICATION Take 1 tablet by mouth daily. Taking 1 tab of each version of Juice Plus vitamins / Unknown strength     psyllium (METAMUCIL) 58.6 % packet Take 1 packet by mouth daily.     rivaroxaban (XARELTO) 20 MG TABS tablet Take 1 tablet (20 mg total) by mouth daily with supper. (Patient taking differently: Take 20 mg by mouth as needed (if AFIB events last more than 24hrs).) 30 tablet 3   rosuvastatin (CRESTOR) 10 MG tablet TAKE 1 TABLET DAILY (Patient taking differently: Take 10 mg by mouth daily.) 90 tablet 3   No facility-administered medications prior to visit.   Allergies  Allergen Reactions   Belladonna Alkaloids Other (See Comments)    Tachycardia -  as a child     Objective:   Today's Vitals   08/30/21 1517  BP: 134/70  Pulse: 78  Temp: (!) 96.5 F (35.8 C)  TempSrc: Temporal  SpO2: 95%  Weight: (!) 323 lb 3.2 oz (146.6 kg)  Height: '6\' 6"'$  (1.981 m)   Body mass index is 37.35 kg/m.   General: Well developed, well nourished. No acute distress. Extremities: Full ROM of the left ankle. There is a prominence over the attachment of the left Achilles tendon at the   calcaneus. No particular tenderness with palpation.  Psych: Alert and oriented. Normal mood and affect.  Health Maintenance Due  Topic Date Due   HIV Screening  Never done   Hepatitis C Screening  Never done   Zoster Vaccines- Shingrix (2 of 2) 09/11/2019      Assessment & Plan:   1. Essential hypertension Blood pressure is in adequate control today. For now, we will continue Zestoretic 20-25 mg daily and Toprol XL 50 mg daily. If his blood pressure control slips, I might consider switching his metoprolol to amlodipine.  - Basic metabolic panel; Future  2. Paroxysmal atrial fibrillation (HCC) We discussed that taking a daily aspirin might benefit him, in light of his episodic a fib, his use of naicin with occasional flushing, and his strong family history of early heart disease.  3. Obstructive sleep apnea On CPAP.  4. Impaired glucose tolerance I will check an A1c and blood sugar to monitor for diabetes development.  - Hemoglobin A1c; Future - Basic metabolic panel; Future  5. Hypertriglyceridemia We will check the status of his triglycerides  and HDL. We discussed his current medication regimen. Rosuvastatin would not be a good choice for lowering triglycerides and raising HDL. Niacin can be effective, but does have side effect issues. We might consider switching to fenofibrate. I will check his fasting lipids first. I will continue his statin, as he did have an elevated coronary calcium score in the past.  - Lipid panel; Future  6. History of thyroid disorder Apparently, he has had some thyroid abnormalities, though he can't remember if it is hyper- or hypo-. He notes a history of thyroid nodules and prior FNA. I thas been some years since any of this was assessed.  - TSH; Future - T4, free; Future  7. Pain of left heel This does seem to be some enthesopathy of the Achilles tendon. I will refer him ot podaitry to assess for management approaches.  - Ambulatory referral to Podiatry  8. Need for shingles vaccine  - Varicella-zoster vaccine IM (Shingrix)    Return in about 3 months (around 11/30/2021) for Reassessment.   Haydee Salter, MD

## 2021-09-02 ENCOUNTER — Ambulatory Visit: Payer: BC Managed Care – PPO | Admitting: Podiatry

## 2021-09-02 ENCOUNTER — Ambulatory Visit (INDEPENDENT_AMBULATORY_CARE_PROVIDER_SITE_OTHER): Payer: BC Managed Care – PPO

## 2021-09-02 ENCOUNTER — Encounter: Payer: Self-pay | Admitting: Podiatry

## 2021-09-02 DIAGNOSIS — M7662 Achilles tendinitis, left leg: Secondary | ICD-10-CM | POA: Diagnosis not present

## 2021-09-02 DIAGNOSIS — M79672 Pain in left foot: Secondary | ICD-10-CM

## 2021-09-02 DIAGNOSIS — M7732 Calcaneal spur, left foot: Secondary | ICD-10-CM | POA: Diagnosis not present

## 2021-09-02 DIAGNOSIS — R600 Localized edema: Secondary | ICD-10-CM | POA: Diagnosis not present

## 2021-09-02 DIAGNOSIS — M7989 Other specified soft tissue disorders: Secondary | ICD-10-CM | POA: Diagnosis not present

## 2021-09-02 DIAGNOSIS — M19072 Primary osteoarthritis, left ankle and foot: Secondary | ICD-10-CM | POA: Diagnosis not present

## 2021-09-02 MED ORDER — METHYLPREDNISOLONE 4 MG PO TBPK: ORAL_TABLET | ORAL | 0 refills | Status: DC

## 2021-09-02 NOTE — Progress Notes (Addendum)
  Subjective:  Patient ID: Dylan Vaughn, male    DOB: 11/28/1970,   MRN: [id number]  Chief Complaint  Patient presents with   Foot Pain    Left heel pain ongoing for 6 months     51 y.o. male presents for concern of left heel pain that has been going on for 6 months. Relates he has had constant burning pain in the back of the ankle. Also relates some pain in his right great toe joint as well and concerned for possible gout but not currently painful . Denies any other pedal complaints. Denies n/v/f/c.   Past Medical History:  Diagnosis Date   Blood transfusion without reported diagnosis    2000 with colectomy    Diverticular disease 04/25/2018   Diverticulitis of colon with bleeding 2000   Elevated hemoglobin A1c measurement 04/30/2018   Essential hypertension 04/25/2018   High blood pressure    High cholesterol    History of thyroid disorder 04/25/2018   S/p biopsy WNL   Hypertriglyceridemia 08/21/2018   Impaired glucose tolerance 05/22/2018   Per record review, fasting Glc elevated but A1C 12/2016 was 5.5   Mixed hyperlipidemia 04/25/2018   Obstructive sleep apnea 02/18/2019   Paroxysmal atrial fibrillation ([place]) 02/18/2019    Objective:  Physical Exam: Vascular: DP/PT pulses 2/4 bilateral. CFT <3 seconds. Normal hair growth on digits. No edema.  Skin. No lacerations or abrasions bilateral feet.  Musculoskeletal: MMT 5/5 bilateral lower extremities in DF, PF, Inversion and Eversion. Deceased ROM in DF of ankle joint. Tender to insertion of achilles tendon on the left. No pain proximally along the tendon. No pain to medial calcaneal tubercle. No pain with DF or PF.  Neurological: Sensation intact to light touch.   Assessment:   1. Tendonitis, Achilles, left      Plan:  Patient was evaluated and treated and all questions answered. -Xrays reviewed. No acute fractures or dislocations. There is spurring noted to the posterior calcaneus on the left.  -Discussed Achilles insertional  tendonitis and treatment options with patient.  -Cotinue stretching and heel lifts.  -Rx medrol dose pack provided.  -Referral to physical therapy placed.  -Discussed if no improvement will consider MRI/EPAT/PRP injections.  -Patient to return to office in 8 weeks for check.    Lorenda Peck, DPM

## 2021-09-02 NOTE — Patient Instructions (Signed)

## 2021-09-03 ENCOUNTER — Ambulatory Visit: Payer: BC Managed Care – PPO | Attending: Podiatry | Admitting: Physical Therapy

## 2021-09-03 DIAGNOSIS — M25672 Stiffness of left ankle, not elsewhere classified: Secondary | ICD-10-CM | POA: Insufficient documentation

## 2021-09-03 DIAGNOSIS — M7662 Achilles tendinitis, left leg: Secondary | ICD-10-CM | POA: Insufficient documentation

## 2021-09-03 DIAGNOSIS — M6281 Muscle weakness (generalized): Secondary | ICD-10-CM | POA: Insufficient documentation

## 2021-09-03 DIAGNOSIS — M25572 Pain in left ankle and joints of left foot: Secondary | ICD-10-CM | POA: Diagnosis not present

## 2021-09-03 DIAGNOSIS — R262 Difficulty in walking, not elsewhere classified: Secondary | ICD-10-CM | POA: Diagnosis not present

## 2021-09-03 NOTE — Therapy (Signed)
[facility] [facility] [address] [place] [place], [place], [address] Phone: [phone]   Fax:  [phone]  Physical Therapy Evaluation  Patient Details  Name: Dylan Vaughn MRN: [id number] Date of Birth: 15-Nov-1970 Referring Provider (PT): Lorenda Peck, DPM  Rationale for Evaluation and Treatment Rehabilitation  Encounter Date: 09/03/2021   PT End of Session - 09/03/21 1154     Visit Number 1    Number of Visits 6    Date for PT Re-Evaluation 10/15/21    Authorization Type BCBS    PT Start Time 1020    PT Stop Time 1105    PT Time Calculation (min) 45 min    Activity Tolerance Patient tolerated treatment well    Behavior During Therapy [facility] for tasks assessed/performed             Past Medical History:  Diagnosis Date   Blood transfusion without reported diagnosis    2000 with colectomy    Diverticular disease 04/25/2018   Diverticulitis of colon with bleeding 2000   Elevated hemoglobin A1c measurement 04/30/2018   Essential hypertension 04/25/2018   High blood pressure    High cholesterol    History of thyroid disorder 04/25/2018   S/p biopsy WNL   Hypertriglyceridemia 08/21/2018   Impaired glucose tolerance 05/22/2018   Per record review, fasting Glc elevated but A1C 12/2016 was 5.5   Mixed hyperlipidemia 04/25/2018   Obstructive sleep apnea 02/18/2019   Paroxysmal atrial fibrillation ([place]) 02/18/2019    Past Surgical History:  Procedure Laterality Date   COLECTOMY  2000   Ruptured diverticulum- ascending and transverse colon removed    COLONOSCOPY  2000   ruptured diverticulum    EYE SURGERY     1978/1988   INGUINAL HERNIA REPAIR Left 1976   POSTERIOR CRUCIATE LIGAMENT RECONSTRUCTION Left 1991   UPPER GASTROINTESTINAL ENDOSCOPY     2000    There were no vitals filed for this visit.    Subjective Assessment - 09/03/21 1021     Subjective Pt states he took a step and heard his Achilles pop. Has had history of  Achilles tendonitis. Has done PT prior for that ankle and did well with it. Has tried old exercises but has not helped. It's now been 6 months. Less tender to touch now but still burning especially with walking. It will relax from shooting pain to a dull pain within 2 miles. Pt did get x-rays. Has been wearing "super feet". Can get up stairs with pain but going down stairs is hard. Has been placed on a steroid -- just started it.    Limitations Standing;Walking;House hold activities    How long can you sit comfortably? n/a    How long can you stand comfortably? As much as needed    How long can you walk comfortably? Increased especially after sitting for too long -- Able to push through and walk as much as needed    Currently in Pain? Yes    Pain Score 4     Pain Location Ankle    Pain Orientation Left                [facility] PT Assessment - 09/03/21 0001       Assessment   Medical Diagnosis M76.62 (ICD-10-CM) - Tendonitis, Achilles, left    Referring Provider (PT) Lorenda Peck, DPM    Onset Date/Surgical Date --   ~6 months ago   Prior Therapy For L achilles tendonitis  Precautions   Precautions None      Restrictions   Weight Bearing Restrictions No      Balance Screen   Has the patient fallen in the past 6 months No      [place] residence    Living Arrangements Spouse/significant other    Available Help at [place] Full time employment    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      Observation/Other Assessments   Focus on Therapeutic Outcomes (FOTO)  n/a      Functional Tests   Functional tests Single leg stance;Other;Step down      Step Down   Comments Pain on left heel along medial achilles tendon -- with heel down and with heel up      Single Leg Stance   Comments R LE: 5.07 sec; L LE: 2.5 sec      ROM / Strength   AROM / PROM / Strength  AROM;Strength      AROM   Overall AROM Comments L big toe extension < R toe extension    AROM Assessment Site Ankle    Right/Left Ankle Left;Right    Right Ankle Dorsiflexion 16    Right Ankle Plantar Flexion 57    Right Ankle Inversion 33    Right Ankle Eversion 24    Left Ankle Dorsiflexion 10    Left Ankle Plantar Flexion 45    Left Ankle Inversion 22    Left Ankle Eversion 25      Strength   Overall Strength Comments 5/5 knee, hip and ankle bilat      Palpation   Palpation comment TTP along L gastroc and insertion of Achilles tendon                        Objective measurements completed on examination: See above findings.                PT Education - 09/03/21 1154     Education Details Discussed exam findings, POC, and initial HEP    Person(s) Educated Patient    Methods Explanation;Demonstration;Tactile cues;Verbal cues;Handout    Comprehension Verbalized understanding;Returned demonstration;Verbal cues required;Tactile cues required                 PT Long Term Goals - 09/03/21 1200       PT LONG TERM GOAL #1   Title Pt will be independent with HEP    Time 6    Period Weeks    Status New    Target Date 10/15/21      PT LONG TERM GOAL #2   Title Pt have L = R foot/ankle AROM    Time 6    Period Weeks    Status New    Target Date 10/15/21      PT LONG TERM GOAL #3   Title Pt will report >/= 50% resolution of foot pain with activity    Time 6    Period Weeks    Status New    Target Date 10/15/21      PT LONG TERM GOAL #4   Title Pt will be able to perform SLS for at least 8 sec bilat to demo improved foot/ankle stability    Baseline 5 sec on R, 2.5 sec on L    Time 6    Period Weeks  Status New    Target Date 10/15/21                    Plan - 09/03/21 1155     Clinical Impression Statement Dylan Vaughn is a 51 y/o M presenting to OPPT due to complaint of ongoing L Achilles tendonitis x 6  months. Has been treated previously for this at PT and resolved. Has tried some of his previous exercises at home but inconsistently. On assessment, pt demos L>R ankle and foot hypomobility and decreased ROM. Pt appears to have weaker L toe extensors vs R. Pt with multiple trigger points in gastroc upon palpation and L>R foot/ankle instability. Pt would benefit from PT to address these issues for return to normal level of function. Provided pt heel lifts this session to decrease strain in Achilles.    Personal Factors and Comorbidities Fitness;Past/Current Experience;Time since onset of injury/illness/exacerbation;Profession    Examination-Activity Limitations Locomotion Level;Transfers;Stairs    Examination-Participation Restrictions Community Activity;Shop;Yard Work    Stability/Clinical Decision Making Stable/Uncomplicated    Clinical Decision Making Low    Rehab Potential Good    PT Frequency 1x / week    PT Duration 6 weeks    PT Treatment/Interventions ADLs/Self Care Home Management;Aquatic Therapy;Electrical Stimulation;Cryotherapy;Moist Heat;Iontophoresis '4mg'$ /ml Dexamethasone;Gait training;Stair training;Functional mobility training;Therapeutic activities;Therapeutic exercise;Balance training;Neuromuscular re-education;Patient/family education;Manual techniques;Passive range of motion;Dry needling;Taping    PT Next Visit Plan Review and modify HEP as indicated. TPDN as needed for gastroc/soleus. Trial of taping. Work on toe/foot.    PT Home Exercise Plan Access Code [email or web address]    [email or web address] and Agree with Plan of Care Patient             Patient will benefit from skilled therapeutic intervention in order to improve the following deficits and impairments:  Decreased range of motion, Difficulty walking, Increased fascial restricitons, Increased muscle spasms, Decreased activity tolerance, Pain, Hypomobility, Impaired flexibility, Decreased mobility, Decreased strength  Visit  Diagnosis: Stiffness of left ankle, not elsewhere classified  Pain in left ankle and joints of left foot  Difficulty in walking, not elsewhere classified  Muscle weakness (generalized)     Problem List Patient Active Problem List   Diagnosis Date Noted   Uvulitis 03/31/2021   Obstructive sleep apnea 02/18/2019   Paroxysmal atrial fibrillation ([place]) 02/18/2019   Hypertriglyceridemia 08/21/2018   Impaired glucose tolerance 05/22/2018   Essential hypertension 04/25/2018   Mixed hyperlipidemia 04/25/2018   Diverticular disease 04/25/2018   History of thyroid disorder 04/25/2018   Diverticulitis of colon with bleeding 2000    [facility] April Gordy Levan, PT, DPT 09/03/2021, 12:10 PM  [facility] [place], [place], [address] Phone: [phone]   Fax:  [phone]  Name: Dylan Vaughn MRN: [id number] Date of Birth: 1970/07/07

## 2021-09-09 ENCOUNTER — Encounter: Payer: Self-pay | Admitting: Physical Therapy

## 2021-09-09 ENCOUNTER — Ambulatory Visit: Payer: BC Managed Care – PPO | Admitting: Physical Therapy

## 2021-09-09 DIAGNOSIS — R262 Difficulty in walking, not elsewhere classified: Secondary | ICD-10-CM

## 2021-09-09 DIAGNOSIS — M25672 Stiffness of left ankle, not elsewhere classified: Secondary | ICD-10-CM

## 2021-09-09 DIAGNOSIS — M7662 Achilles tendinitis, left leg: Secondary | ICD-10-CM | POA: Diagnosis not present

## 2021-09-09 DIAGNOSIS — M6281 Muscle weakness (generalized): Secondary | ICD-10-CM | POA: Diagnosis not present

## 2021-09-09 DIAGNOSIS — M25572 Pain in left ankle and joints of left foot: Secondary | ICD-10-CM | POA: Diagnosis not present

## 2021-09-09 NOTE — Therapy (Signed)
OUTPATIENT PHYSICAL THERAPY TREATMENT NOTE   Patient Name: Dylan Vaughn MRN: [id number] DOB:10/26/1970, 51 y.o., male Today's Date: 09/09/2021  PCP: Timothy Lasso PROVIDER: Lorenda Peck   PT End of Session - 09/09/21 0800     Visit Number 2    Number of Visits 6    Date for PT Re-Evaluation 10/15/21    Authorization Type BCBS    PT Start Time 0800    PT Stop Time 0840    PT Time Calculation (min) 40 min    Activity Tolerance Patient tolerated treatment well    Behavior During Therapy [facility] for tasks assessed/performed             Past Medical History:  Diagnosis Date   Blood transfusion without reported diagnosis    2000 with colectomy    Diverticular disease 04/25/2018   Diverticulitis of colon with bleeding 2000   Elevated hemoglobin A1c measurement 04/30/2018   Essential hypertension 04/25/2018   High blood pressure    High cholesterol    History of thyroid disorder 04/25/2018   S/p biopsy WNL   Hypertriglyceridemia 08/21/2018   Impaired glucose tolerance 05/22/2018   Per record review, fasting Glc elevated but A1C 12/2016 was 5.5   Mixed hyperlipidemia 04/25/2018   Obstructive sleep apnea 02/18/2019   Paroxysmal atrial fibrillation ([place]) 02/18/2019   Past Surgical History:  Procedure Laterality Date   COLECTOMY  2000   Ruptured diverticulum- ascending and transverse colon removed    COLONOSCOPY  2000   ruptured diverticulum    EYE SURGERY     1978/1988   INGUINAL HERNIA REPAIR Left 1976   POSTERIOR CRUCIATE LIGAMENT RECONSTRUCTION Left 1991   UPPER GASTROINTESTINAL ENDOSCOPY     2000   Patient Active Problem List   Diagnosis Date Noted   Uvulitis 03/31/2021   Obstructive sleep apnea 02/18/2019   Paroxysmal atrial fibrillation ([place]) 02/18/2019   Hypertriglyceridemia 08/21/2018   Impaired glucose tolerance 05/22/2018   Essential hypertension 04/25/2018   Mixed hyperlipidemia 04/25/2018   Diverticular disease 04/25/2018   History of thyroid  disorder 04/25/2018   Diverticulitis of colon with bleeding 2000    REFERRING DIAG: L Achilles Tendonitis  THERAPY DIAG:  Stiffness of left ankle, not elsewhere classified  Pain in left ankle and joints of left foot  Difficulty in walking, not elsewhere classified  Muscle weakness (generalized)  Rationale for Evaluation and Treatment Rehabilitation  PERTINENT HISTORY: Prior Achilles tendonitis and plantar fasciitis  PRECAUTIONS: None  SUBJECTIVE: Pt has been wearing heel lifts. Went around [place] and felt really good walking. Played golf and thinks he might have overdone it because it started to hurt. Pain has been more manageable to walk.   PAIN:  Are you having pain? Yes: NPRS scale: 2/10 Pain location: Heel Pain description: Burning Aggravating factors: Walking Relieving factors: n/a     TODAY'S TREATMENT:  09/09/21 Manual therapy: Skilled assessment and palpation for TPDN TPDN of gastroc/soleus/peroneals -- illicited twitch response and lengthening  Therex: Standing  Gastroc stretch 2x30 sec  Soleus stretch 2x30 sec  Eccentric heel raises on ground x10  Eccentric heel raise toes turned out x10  Eccentric heel raise toes turned in x10  Arch lifting x10  Sitting  Toe yoga 2x10  Toe spreading 2x10  Arch lifting x10   PATIENT EDUCATION: Education details: Discussed HEP updates Person educated: Patient Education method: Explanation, Demonstration, Verbal cues, and Handouts Education comprehension: verbalized understanding and returned demonstration   HOME EXERCISE PROGRAM: Access Code [email or web address]  PT Long Term Goals - 09/03/21 1200       PT LONG TERM GOAL #1   Title Pt will be independent with HEP    Time 6    Period Weeks    Status New    Target Date 10/15/21      PT LONG TERM GOAL #2   Title Pt have L = R foot/ankle AROM    Time 6    Period Weeks    Status New    Target Date 10/15/21      PT LONG TERM GOAL #3   Title Pt will  report >/= 50% resolution of foot pain with activity    Time 6    Period Weeks    Status New    Target Date 10/15/21      PT LONG TERM GOAL #4   Title Pt will be able to perform SLS for at least 8 sec bilat to demo improved foot/ankle stability    Baseline 5 sec on R, 2.5 sec on L    Time 6    Period Weeks    Status New    Target Date 10/15/21              Plan - 09/09/21 0846     Clinical Impression Statement Pt notes improved pain with heel lift and prednisone. Has been performing exercises. Continued trigger points along gastroc/soleus -- performed trial of TPDN this session. Progressed foot/ankle strengthening exercises as able.    Personal Factors and Comorbidities Fitness;Past/Current Experience;Time since onset of injury/illness/exacerbation;Profession    Examination-Activity Limitations Locomotion Level;Transfers;Stairs    Examination-Participation Restrictions Community Activity;Shop;Yard Work    Stability/Clinical Decision Making Stable/Uncomplicated    Rehab Potential Good    PT Frequency 1x / week    PT Duration 6 weeks    PT Treatment/Interventions ADLs/Self Care Home Management;Aquatic Therapy;Electrical Stimulation;Cryotherapy;Moist Heat;Iontophoresis '4mg'$ /ml Dexamethasone;Gait training;Stair training;Functional mobility training;Therapeutic activities;Therapeutic exercise;Balance training;Neuromuscular re-education;Patient/family education;Manual techniques;Passive range of motion;Dry needling;Taping    PT Next Visit Plan Review and modify HEP as indicated. TPDN as needed for gastroc/soleus. Trial of taping. Work on Customer service manager. Consider working on foam or bosu.    PT Home Exercise Plan Access Code [email or web address]    [email or web address] and Agree with Plan of Care Patient               [facility] Beatrix Shipper [place], PT, DPT 09/09/2021, 8:48 AM

## 2021-09-23 ENCOUNTER — Encounter: Payer: Self-pay | Admitting: Podiatry

## 2021-09-27 ENCOUNTER — Ambulatory Visit: Payer: BC Managed Care – PPO | Attending: Podiatry | Admitting: Physical Therapy

## 2021-09-27 ENCOUNTER — Encounter: Payer: Self-pay | Admitting: Physical Therapy

## 2021-09-27 DIAGNOSIS — M6281 Muscle weakness (generalized): Secondary | ICD-10-CM | POA: Diagnosis not present

## 2021-09-27 DIAGNOSIS — M25572 Pain in left ankle and joints of left foot: Secondary | ICD-10-CM

## 2021-09-27 DIAGNOSIS — R262 Difficulty in walking, not elsewhere classified: Secondary | ICD-10-CM

## 2021-09-27 DIAGNOSIS — M25672 Stiffness of left ankle, not elsewhere classified: Secondary | ICD-10-CM | POA: Diagnosis not present

## 2021-09-27 NOTE — Therapy (Addendum)
OUTPATIENT PHYSICAL THERAPY TREATMENT NOTE AND DISCHARGE   Patient Name: Dylan Vaughn MRN: [id number] DOB:08-Feb-1971, 51 y.o., male Today's Date: 09/27/2021  PCP: Arlester Marker REFERRING PROVIDER: Sikora, Jerome THERAPY DISCHARGE SUMMARY  Visits from Start of Care: 3  Current functional level related to goals / functional outcomes: See below   Remaining deficits: See below   Education / Equipment: See below   Patient agrees to discharge. Patient goals were not met. Patient is being discharged due to not returning since the last visit.    PT End of Session - 09/27/21 0759     Visit Number 3    Number of Visits 6    Date for PT Re-Evaluation 10/15/21    Authorization Type BCBS    PT Start Time 4665    PT Stop Time 0838    PT Time Calculation (min) 40 min    Activity Tolerance Patient tolerated treatment well    Behavior During Therapy [facility] for tasks assessed/performed             Past Medical History:  Diagnosis Date   Blood transfusion without reported diagnosis    2000 with colectomy    Diverticular disease 04/25/2018   Diverticulitis of colon with bleeding 2000   Elevated hemoglobin A1c measurement 04/30/2018   Essential hypertension 04/25/2018   High blood pressure    High cholesterol    History of thyroid disorder 04/25/2018   S/p biopsy WNL   Hypertriglyceridemia 08/21/2018   Impaired glucose tolerance 05/22/2018   Per record review, fasting Glc elevated but A1C 12/2016 was 5.5   Mixed hyperlipidemia 04/25/2018   Obstructive sleep apnea 02/18/2019   Paroxysmal atrial fibrillation ([place]) 02/18/2019   Past Surgical History:  Procedure Laterality Date   COLECTOMY  2000   Ruptured diverticulum- ascending and transverse colon removed    COLONOSCOPY  2000   ruptured diverticulum    EYE SURGERY     1978/1988   INGUINAL HERNIA REPAIR Left 1976   POSTERIOR CRUCIATE LIGAMENT RECONSTRUCTION Left 1991   UPPER GASTROINTESTINAL ENDOSCOPY     2000    Patient Active Problem List   Diagnosis Date Noted   Uvulitis 03/31/2021   Obstructive sleep apnea 02/18/2019   Paroxysmal atrial fibrillation ([place]) 02/18/2019   Hypertriglyceridemia 08/21/2018   Impaired glucose tolerance 05/22/2018   Essential hypertension 04/25/2018   Mixed hyperlipidemia 04/25/2018   Diverticular disease 04/25/2018   History of thyroid disorder 04/25/2018   Diverticulitis of colon with bleeding 2000    REFERRING DIAG: L Achilles Tendonitis  THERAPY DIAG:  Stiffness of left ankle, not elsewhere classified  Pain in left ankle and joints of left foot  Difficulty in walking, not elsewhere classified  Muscle weakness (generalized)  Rationale for Evaluation and Treatment Rehabilitation  PERTINENT HISTORY: Prior Achilles tendonitis and plantar fasciitis  PRECAUTIONS: None  SUBJECTIVE: Pt feels that TPDN helped. Reports that he was able to golf but his heel started to hurt at the end of the day. Still continues to use heel lift. Pt states he probably did ~15,000 steps/day.   PAIN:  Are you having pain? Yes: NPRS scale: 2/10 Pain location: Heel Pain description: Burning Aggravating factors: Walking Relieving factors: n/a     TODAY'S TREATMENT:  09/27/21 Recumbent bike warm up x 5 min Manual therapy: Skilled assessment and palpation for TPDN Trigger Point Dry-Needling  Treatment instructions: Expect mild to moderate muscle soreness. S/S of pneumothorax if dry needled over a lung field, and to seek immediate  medical attention should they occur. Patient verbalized understanding of these instructions and education.  Patient Consent Given: Yes Education handout provided: Previously provided Muscles treated: gastroc/soleus Electrical stimulation performed: No Parameters: N/A Treatment response/outcome: Twitch response, increased muscle length STM and TPR of gastroc Standing  Gastroc stretch 2x30 sec  Soleus stretch 2x30 sec  Arch lifting with hip  hinge forward/back x10  Towel scrunch x10  Toe yoga x10  On airex SLS x30 sec Leg press:  Eccentric heel raise 13 plates 2x10  Eccentric heel raise soleus 10 plates 2x10   08/18/29 Manual therapy: Skilled assessment and palpation for TPDN TPDN of gastroc/soleus/peroneals -- illicited twitch response and lengthening  Therex: Standing  Gastroc stretch 2x30 sec  Soleus stretch 2x30 sec  Eccentric heel raises on ground x10  Eccentric heel raise toes turned out x10  Eccentric heel raise toes turned in x10  Arch lifting x10    Sitting  Toe yoga 2x10  Toe spreading 2x10  Arch lifting x10   PATIENT EDUCATION: Education details: Discussed HEP updates Person educated: Patient Education method: Consulting civil engineer, Demonstration, Verbal cues, and Handouts Education comprehension: verbalized understanding and returned demonstration   HOME EXERCISE PROGRAM: Access Code [email or web address]     PT Long Term Goals - 09/03/21 1200       PT LONG TERM GOAL #1   Title Pt will be independent with HEP    Time 6    Period Weeks    Status New    Target Date 10/15/21      PT LONG TERM GOAL #2   Title Pt have L = R foot/ankle AROM    Time 6    Period Weeks    Status New    Target Date 10/15/21      PT LONG TERM GOAL #3   Title Pt will report >/= 50% resolution of foot pain with activity    Time 6    Period Weeks    Status New    Target Date 10/15/21      PT LONG TERM GOAL #4   Title Pt will be able to perform SLS for at least 8 sec bilat to demo improved foot/ankle stability    Baseline 5 sec on R, 2.5 sec on L    Time 6    Period Weeks    Status New    Target Date 10/15/21               Plan    Clinical Impression Statement Pt reports his foot did not bother him too much during his vacation -- able to hike with heel lifts without difficulty. Notes increased pain at the end of the day. Progressed standing foot/ankle exercises. Continues to not be able to tolerate single leg  eccentric heel raises; discussed using leg press machine. Continued TPDN   Personal Factors and Comorbidities Fitness;Past/Current Experience;Time since onset of injury/illness/exacerbation;Profession    Examination-Activity Limitations Locomotion Level;Transfers;Stairs    Examination-Participation Restrictions Community Activity;Shop;Yard Work    Stability/Clinical Decision Making Stable/Uncomplicated    Rehab Potential Good    PT Frequency 1x / week    PT Duration 6 weeks    PT Treatment/Interventions ADLs/Self Care Home Management;Aquatic Therapy;Electrical Stimulation;Cryotherapy;Moist Heat;Iontophoresis 72m/ml Dexamethasone;Gait training;Stair training;Functional mobility training;Therapeutic activities;Therapeutic exercise;Balance training;Neuromuscular re-education;Patient/family education;Manual techniques;Passive range of motion;Dry needling;Taping    PT Next Visit Plan Review and modify HEP as indicated. TPDN as needed for gastroc/soleus. Trial of taping. Work on tCustomer service manager Consider working on foam or bosu.  PT Home Exercise Plan Access Code [email or web address]    [email or web address] and Agree with Plan of Care Patient             [facility] Beatrix Shipper [place], PT, DPT 09/27/2021, 8:39 AM

## 2021-10-06 ENCOUNTER — Encounter: Payer: BC Managed Care – PPO | Admitting: Physical Therapy

## 2021-10-18 ENCOUNTER — Ambulatory Visit: Payer: BC Managed Care – PPO | Admitting: Physical Therapy

## 2021-10-22 ENCOUNTER — Ambulatory Visit: Payer: BC Managed Care – PPO | Admitting: Podiatry

## 2021-10-28 ENCOUNTER — Ambulatory Visit: Payer: BC Managed Care – PPO | Admitting: Cardiology

## 2021-11-12 DIAGNOSIS — G4733 Obstructive sleep apnea (adult) (pediatric): Secondary | ICD-10-CM | POA: Diagnosis not present

## 2021-11-22 ENCOUNTER — Other Ambulatory Visit: Payer: Self-pay | Admitting: Cardiology

## 2021-12-01 ENCOUNTER — Ambulatory Visit: Payer: BC Managed Care – PPO | Admitting: Family Medicine

## 2021-12-28 ENCOUNTER — Telehealth: Payer: Self-pay | Admitting: *Deleted

## 2021-12-28 NOTE — Patient Outreach (Signed)
  Care Coordination   12/28/2021 Name: Rajon Bisig MRN: [id number] DOB: Jan 03, 1971   Care Coordination Outreach Attempts:  An unsuccessful telephone outreach was attempted today to offer the patient information about available care coordination services as a benefit of their health plan.   Follow Up Plan:  Additional outreach attempts will be made to offer the patient care coordination information and services.   Encounter Outcome:  No Answer  Care Coordination Interventions Activated:  No   Care Coordination Interventions:  No, not indicated    Eduard Clos MSW, LCSW Licensed Clinical Social Worker      [phone]

## 2022-01-17 ENCOUNTER — Telehealth: Payer: Self-pay

## 2022-01-17 NOTE — Patient Outreach (Signed)
  Care Coordination   01/17/2022 Name: Dylan Vaughn MRN: [id number] DOB: 12/21/1970   Care Coordination Outreach Attempts:  A second unsuccessful outreach was attempted today to offer the patient with information about available care coordination services as a benefit of their health plan.     Follow Up Plan:  Additional outreach attempts will be made to offer the patient care coordination information and services.   Encounter Outcome:  No Answer   Care Coordination Interventions:  No, not indicated    Tomasa Rand, RN, BSN, [facility] [employer] [phone]

## 2022-01-19 ENCOUNTER — Telehealth: Payer: Self-pay

## 2022-01-19 NOTE — Patient Outreach (Signed)
  Care Coordination   01/19/2022 Name: Uchenna Rappaport MRN: [id number] DOB: 1970/12/30   Care Coordination Outreach Attempts:  A third unsuccessful outreach was attempted today to offer the patient with information about available care coordination services as a benefit of their health plan.   Follow Up Plan:  No further outreach attempts will be made at this time. We have been unable to contact the patient to offer or enroll patient in care coordination services  Encounter Outcome:  No Answer   Care Coordination Interventions:  No, not indicated    Tomasa Rand, RN, BSN, CEN [place] Coordinator [phone]

## 2022-01-25 ENCOUNTER — Telehealth: Payer: Self-pay

## 2022-01-25 NOTE — Patient Outreach (Signed)
  Care Coordination   Initial Visit Note   01/25/2022 Name: Dylan Vaughn MRN: [id number] DOB: Jan 08, 1971  Dylan Vaughn is a 51 y.o. year old male who sees Rudd, Lillette Boxer, MD for primary care. I spoke with  Ernestene Mention by phone today.  What matters to the patients health and wellness today?  Placed call to patient and reviewed and offered [facility] care coordination program.  Patient denies any needs at this time.      SDOH assessments and interventions completed:  No     Care Coordination Interventions:  No, not indicated   Follow up plan: No further intervention required.   Encounter Outcome:  Pt. Refused   Tomasa Rand, RN, BSN, CEN [facility] [employer] [phone]

## 2022-02-10 ENCOUNTER — Other Ambulatory Visit: Payer: Self-pay | Admitting: Cardiology

## 2022-02-10 DIAGNOSIS — G4733 Obstructive sleep apnea (adult) (pediatric): Secondary | ICD-10-CM | POA: Diagnosis not present

## 2022-03-28 ENCOUNTER — Other Ambulatory Visit: Payer: Self-pay | Admitting: Family Medicine

## 2022-03-29 ENCOUNTER — Other Ambulatory Visit: Payer: Self-pay | Admitting: Sports Medicine

## 2022-05-06 ENCOUNTER — Encounter: Payer: Self-pay | Admitting: Family Medicine

## 2022-05-06 ENCOUNTER — Ambulatory Visit: Payer: BC Managed Care – PPO | Admitting: Family Medicine

## 2022-05-06 VITALS — BP 151/93 | HR 86 | Ht 78.0 in | Wt 330.0 lb

## 2022-05-06 DIAGNOSIS — R7302 Impaired glucose tolerance (oral): Secondary | ICD-10-CM | POA: Diagnosis not present

## 2022-05-06 DIAGNOSIS — I10 Essential (primary) hypertension: Secondary | ICD-10-CM | POA: Diagnosis not present

## 2022-05-06 DIAGNOSIS — E66812 Obesity, class 2: Secondary | ICD-10-CM | POA: Insufficient documentation

## 2022-05-06 DIAGNOSIS — R7989 Other specified abnormal findings of blood chemistry: Secondary | ICD-10-CM | POA: Diagnosis not present

## 2022-05-06 DIAGNOSIS — Z6838 Body mass index (BMI) 38.0-38.9, adult: Secondary | ICD-10-CM

## 2022-05-06 DIAGNOSIS — R6882 Decreased libido: Secondary | ICD-10-CM

## 2022-05-06 DIAGNOSIS — Z8639 Personal history of other endocrine, nutritional and metabolic disease: Secondary | ICD-10-CM | POA: Diagnosis not present

## 2022-05-06 DIAGNOSIS — R7303 Prediabetes: Secondary | ICD-10-CM | POA: Diagnosis not present

## 2022-05-06 LAB — POCT GLYCOSYLATED HEMOGLOBIN (HGB A1C): Hemoglobin A1C: 5.9 % — AB (ref 4.0–5.6)

## 2022-05-06 MED ORDER — WEGOVY 0.25 MG/0.5ML ~~LOC~~ SOAJ: 0.2500 mg | SUBCUTANEOUS | 0 refills | Status: DC

## 2022-05-06 MED ORDER — ROSUVASTATIN CALCIUM 10 MG PO TABS: 10.0000 mg | ORAL_TABLET | Freq: Every day | ORAL | 0 refills | Status: DC

## 2022-05-06 MED ORDER — ROSUVASTATIN CALCIUM 10 MG PO TABS: 10.0000 mg | ORAL_TABLET | Freq: Every day | ORAL | 3 refills | Status: DC

## 2022-05-06 MED ORDER — LISINOPRIL-HYDROCHLOROTHIAZIDE 20-25 MG PO TABS: ORAL_TABLET | ORAL | 3 refills | Status: DC

## 2022-05-06 MED ORDER — LISINOPRIL-HYDROCHLOROTHIAZIDE 20-25 MG PO TABS: ORAL_TABLET | ORAL | 0 refills | Status: DC

## 2022-05-06 NOTE — Assessment & Plan Note (Signed)
-   pt has hx of subclinical hyperthyroidism, will go ahead and check TSH

## 2022-05-06 NOTE — Assessment & Plan Note (Signed)
-   have sent prescription for wegovy in to help with weight loss  - I believe with weight loss we can decrease risk for diabetes and get him out of the prediabetic category  - follow up in one month to check efficacy

## 2022-05-06 NOTE — Assessment & Plan Note (Signed)
-   poc A1c 5.9%  - discussed holding metformin for now and we will go and do weight loss management with wegovy  - discussed diet and exercise modification  - on statin

## 2022-05-06 NOTE — Assessment & Plan Note (Signed)
-   have ordered testosterone level

## 2022-05-06 NOTE — Progress Notes (Signed)
Established patient visit   Patient: Dylan Vaughn   DOB: 07-29-1970   52 y.o. Male  MRN: [id number] Visit Date: 05/06/2022  Today's healthcare provider: Owens Loffler, DO   Chief Complaint  Patient presents with   [place]    Chief Complaint  Patient presents with   Establish Care   HPI  Pt presents to establish care. He was a previous patient of Dr. Sheppard Coil. He is concerned today about neuropathy in his feet. He is also concerned about diabetes.   HTN He is currently on lisinopril-hctz 20-25mg  but has been out of his medication for a few weeks now. BP today is elevated to 151/93. Denies chest pain, shortness of breath.    Hx of afib related to alcohol. He meets with cardiology who manages. He is prescribed xarelto only if he has episode for longer than 24 hours.  HLD - on Rosuvastatin   Also has concerns of decreased sex drive in the evening times and wants his testosterone checked.  Review of Systems  Constitutional:  Negative for activity change, fatigue and fever.  Respiratory:  Negative for cough and shortness of breath.   Cardiovascular:  Negative for chest pain.  Gastrointestinal:  Negative for abdominal pain.  Genitourinary:  Negative for difficulty urinating.       Current Meds  Medication Sig   AMBULATORY NON FORMULARY MEDICATION Supply ordered: PORTABLE CPAP and other supplies needed (headgear, cushions, filters, heated tuubing and water chamber) Dx: obstructive sleep apnea Settings: auto-titration 5-20 cmH2O (Patient taking differently: 1 application  by Other route as directed. Supply ordered: PORTABLE CPAP and other supplies needed (headgear, cushions, filters, heated tuubing and water chamber) Dx: obstructive sleep apnea Settings: auto-titration 5-20 cmH2O)   aspirin EC 81 MG tablet Take 81 mg by mouth daily.   ibuprofen (ADVIL) 200 MG tablet Take 600-800 mg by mouth every 6 (six) hours as needed for moderate pain.    methylPREDNISolone (MEDROL DOSEPAK) 4 MG TBPK tablet Take as directed   metoprolol succinate (TOPROL-XL) 50 MG 24 hr tablet Take 1 tablet (50 mg total) by mouth daily.   niacin (NIASPAN) 1000 MG CR tablet TAKE 1 TABLET AT BEDTIME (NEED APPOINTMENT WITH PRIMARY CARE PHYSICIAN, LAST VISIT WITH PRIMARY CARE WAS MORE THAN 1 YEAR) (Patient taking differently: Take 1,000 mg by mouth at bedtime.)   OVER THE COUNTER MEDICATION Take 1 tablet by mouth daily. Taking 1 tab of each version of Juice Plus vitamins / Unknown strength   psyllium (METAMUCIL) 58.6 % packet Take 1 packet by mouth daily.   WEGOVY 0.25 MG/0.5ML SOAJ Inject 0.25 mg into the skin once a week. Use this dose for 1 month (4 shots) and then increase to next higher dose.   [DISCONTINUED] rosuvastatin (CRESTOR) 10 MG tablet TAKE 1 TABLET DAILY    OBJECTIVE    BP (!) 151/93   Pulse 86   Ht 6\' 6"  (1.981 m)   Wt (!) 330 lb (149.7 kg)   SpO2 97%   BMI 38.14 kg/m   Physical Exam Vitals and nursing note reviewed.  Constitutional:      General: He is not in acute distress.    Appearance: Normal appearance.  HENT:     Head: Normocephalic and atraumatic.     Right Ear: External ear normal.     Left Ear: External ear normal.     Nose: Nose normal.  Eyes:     Conjunctiva/sclera: Conjunctivae normal.  Cardiovascular:  Rate and Rhythm: Normal rate and regular rhythm.  Pulmonary:     Effort: Pulmonary effort is normal.     Breath sounds: Normal breath sounds.  Neurological:     General: No focal deficit present.     Mental Status: He is alert and oriented to person, place, and time.  Psychiatric:        Mood and Affect: Mood normal.        Behavior: Behavior normal.        Thought Content: Thought content normal.        Judgment: Judgment normal.        ASSESSMENT & PLAN    Problem List Items Addressed This Visit       Cardiovascular and Mediastinum   Essential hypertension - Primary    - bp elevated today which is not  unusual given he has been out of his BP meds  - will go ahead and refill meds today  - follow up in one month to ensure bp levels stabilize on medication  - have ordered CBC, CMP, microalbu/cr ratio to check on kidney function due to uncontrolled HTN        Relevant Medications   lisinopril-hydrochlorothiazide (ZESTORETIC) 20-25 MG tablet   rosuvastatin (CRESTOR) 10 MG tablet   Other Relevant Orders   Microalbumin / creatinine urine ratio   CBC   COMPLETE METABOLIC PANEL WITH GFR   Lipid panel     Endocrine   Impaired glucose tolerance   Relevant Orders   POCT glycosylated hemoglobin (Hb A1C) (Completed)     Other   History of thyroid disorder    - pt has hx of subclinical hyperthyroidism, will go ahead and check TSH       Relevant Orders   TSH + free T4   Class 2 severe obesity due to excess calories with serious comorbidity and body mass index (BMI) of 38.0 to 38.9 in adult [facility])    - have sent prescription for wegovy in to help with weight loss  - I believe with weight loss we can decrease risk for diabetes and get him out of the prediabetic category  - follow up in one month to check efficacy       Relevant Medications   WEGOVY 0.25 MG/0.5ML SOAJ   Other Relevant Orders   Lipid panel   Prediabetes    - poc A1c 5.9%  - discussed holding metformin for now and we will go and do weight loss management with wegovy  - discussed diet and exercise modification  - on statin       Relevant Orders   Microalbumin / creatinine urine ratio   Decreased sex drive    - have ordered testosterone level      Relevant Orders   Testosterone , Free and Total    Return in about 4 weeks (around 06/03/2022).      Meds ordered this encounter  Medications   DISCONTD: lisinopril-hydrochlorothiazide (ZESTORETIC) 20-25 MG tablet    Sig: TAKE 1 TABLET DAILY    Dispense:  30 tablet    Refill:  0   DISCONTD: rosuvastatin (CRESTOR) 10 MG tablet    Sig: Take 1 tablet (10 mg total) by  mouth daily.    Dispense:  30 tablet    Refill:  0   lisinopril-hydrochlorothiazide (ZESTORETIC) 20-25 MG tablet    Sig: TAKE 1 TABLET DAILY    Dispense:  90 tablet    Refill:  3   rosuvastatin (CRESTOR)  10 MG tablet    Sig: Take 1 tablet (10 mg total) by mouth daily.    Dispense:  90 tablet    Refill:  3   WEGOVY 0.25 MG/0.5ML SOAJ    Sig: Inject 0.25 mg into the skin once a week. Use this dose for 1 month (4 shots) and then increase to next higher dose.    Dispense:  2 mL    Refill:  0    Orders Placed This Encounter  Procedures   Microalbumin / creatinine urine ratio   CBC   COMPLETE METABOLIC PANEL WITH GFR    Order Specific Question:   Release to patient    Answer:   Immediate    Order Specific Question:   Remote health to draw?    Answer:   No   TSH + free T4   Lipid panel    Order Specific Question:   Has the patient fasted?    Answer:   No    Order Specific Question:   Release to patient    Answer:   Immediate   Testosterone , Free and Total   POCT glycosylated hemoglobin (Hb A1C)     Owens Loffler, DO  [place] at [facility] [phone] (phone) [phone] (fax)  [place]

## 2022-05-06 NOTE — Assessment & Plan Note (Signed)
-   bp elevated today which is not unusual given he has been out of his BP meds  - will go ahead and refill meds today  - follow up in one month to ensure bp levels stabilize on medication  - have ordered CBC, CMP, microalbu/cr ratio to check on kidney function due to uncontrolled HTN

## 2022-05-10 ENCOUNTER — Encounter: Payer: Self-pay | Admitting: Family Medicine

## 2022-05-10 LAB — COMPLETE METABOLIC PANEL WITH GFR
AG Ratio: 1.5 (calc) (ref 1.0–2.5)
ALT: 61 U/L — ABNORMAL HIGH (ref 9–46)
AST: 30 U/L (ref 10–35)
Albumin: 4.3 g/dL (ref 3.6–5.1)
Alkaline phosphatase (APISO): 50 U/L (ref 35–144)
BUN: 9 mg/dL (ref 7–25)
CO2: 27 mmol/L (ref 20–32)
Calcium: 9.1 mg/dL (ref 8.6–10.3)
Chloride: 103 mmol/L (ref 98–110)
Creat: 0.75 mg/dL (ref 0.70–1.30)
Globulin: 2.9 g/dL (calc) (ref 1.9–3.7)
Glucose, Bld: 124 mg/dL — ABNORMAL HIGH (ref 65–99)
Potassium: 4.4 mmol/L (ref 3.5–5.3)
Sodium: 140 mmol/L (ref 135–146)
Total Bilirubin: 0.6 mg/dL (ref 0.2–1.2)
Total Protein: 7.2 g/dL (ref 6.1–8.1)
eGFR: 108 mL/min/{1.73_m2} (ref >=60)

## 2022-05-10 LAB — CBC
HCT: 46.1 % (ref 38.5–50.0)
Hemoglobin: 15.2 g/dL (ref 13.2–17.1)
MCH: 29.2 pg (ref 27.0–33.0)
MCHC: 33 g/dL (ref 32.0–36.0)
MCV: 88.5 fL (ref 80.0–100.0)
MPV: 10.6 fL (ref 7.5–12.5)
Platelets: 242 10*3/uL (ref 140–400)
RBC: 5.21 10*6/uL (ref 4.20–5.80)
RDW: 12.7 % (ref 11.0–15.0)
WBC: 10.4 10*3/uL (ref 3.8–10.8)

## 2022-05-10 LAB — LIPID PANEL
Cholesterol: 165 mg/dL (ref <200)
HDL: 36 mg/dL — ABNORMAL LOW (ref >=40)
LDL Cholesterol (Calc): 86 mg/dL (calc)
Non-HDL Cholesterol (Calc): 129 mg/dL (calc) (ref <130)
Total CHOL/HDL Ratio: 4.6 (calc) (ref <5.0)
Triglycerides: 359 mg/dL — ABNORMAL HIGH (ref <150)

## 2022-05-10 LAB — MICROALBUMIN / CREATININE URINE RATIO
Creatinine, Urine: 125 mg/dL (ref 20–320)
Microalb Creat Ratio: 2 mcg/mg creat (ref <30)
Microalb, Ur: 0.2 mg/dL

## 2022-05-10 LAB — TSH+FREE T4: TSH W/REFLEX TO FT4: 0.55 mIU/L (ref 0.40–4.50)

## 2022-05-10 LAB — TESTOSTERONE, FREE & TOTAL
Free Testosterone: 40.8 pg/mL (ref 35.0–155.0)
Testosterone, Total, LC-MS-MS: 316 ng/dL (ref 250–1100)

## 2022-05-11 ENCOUNTER — Other Ambulatory Visit: Payer: Self-pay | Admitting: Cardiology

## 2022-05-11 DIAGNOSIS — G4733 Obstructive sleep apnea (adult) (pediatric): Secondary | ICD-10-CM | POA: Diagnosis not present

## 2022-05-12 ENCOUNTER — Encounter: Payer: Self-pay | Admitting: Family Medicine

## 2022-05-13 ENCOUNTER — Telehealth: Payer: Self-pay

## 2022-05-13 NOTE — Telephone Encounter (Signed)
Initiated Prior authorization [id number] 0.25MG /0.5ML auto-injectors Via: Covermymeds Case/Key:[id number] Status: approved as of 05/13/22 Reason:Authorization Expiration Date: December 09, 2022. Notified Pt via: Mychart, called pt to notify of the approval

## 2022-05-16 ENCOUNTER — Encounter: Payer: Self-pay | Admitting: Family Medicine

## 2022-05-16 MED ORDER — WEGOVY 0.25 MG/0.5ML ~~LOC~~ SOAJ: 0.2500 mg | SUBCUTANEOUS | 0 refills | Status: DC

## 2022-06-08 NOTE — Progress Notes (Unsigned)
     Established patient visit   Patient: Dylan Vaughn   DOB: 1971/01/22   52 y.o. Male  MRN: [id number] Visit Date: 06/09/2022  Today's healthcare provider: Charlton Amor, DO   No chief complaint on file.   SUBJECTIVE   No chief complaint on file.  HPI  Pt presents for HTN follow up. At last visit, he was out of his medication. He is currently on lisinopril-hctz 20-25mg .   Obesity  Pt on wegovy and has been tolerating it well   Review of Systems  Constitutional:  Negative for activity change, fatigue and fever.  Respiratory:  Negative for cough and shortness of breath.   Cardiovascular:  Negative for chest pain.  Gastrointestinal:  Negative for abdominal pain.  Genitourinary:  Negative for difficulty urinating.       No outpatient medications have been marked as taking for the 06/09/22 encounter (Appointment) with Charlton Amor, DO.    OBJECTIVE    There were no vitals taken for this visit.  Physical Exam Vitals and nursing note reviewed.  Constitutional:      General: He is not in acute distress.    Appearance: Normal appearance.  HENT:     Head: Normocephalic and atraumatic.     Right Ear: External ear normal.     Left Ear: External ear normal.     Nose: Nose normal.  Eyes:     Conjunctiva/sclera: Conjunctivae normal.  Cardiovascular:     Rate and Rhythm: Normal rate and regular rhythm.  Pulmonary:     Effort: Pulmonary effort is normal.     Breath sounds: Normal breath sounds.  Neurological:     General: No focal deficit present.     Mental Status: He is alert and oriented to person, place, and time.  Psychiatric:        Mood and Affect: Mood normal.        Behavior: Behavior normal.        Thought Content: Thought content normal.        Judgment: Judgment normal.      {Show previous labs (optional):23736}    ASSESSMENT & PLAN    Problem List Items Addressed This Visit   None   No follow-ups on file.      No orders of the defined types  were placed in this encounter.   No orders of the defined types were placed in this encounter.    Charlton Amor, DO  [facility] at [facility] [phone] (phone) [phone] (fax)  [facility]

## 2022-06-09 ENCOUNTER — Encounter: Payer: Self-pay | Admitting: Family Medicine

## 2022-06-09 ENCOUNTER — Ambulatory Visit: Payer: BC Managed Care – PPO | Admitting: Family Medicine

## 2022-06-09 VITALS — BP 127/84 | HR 73 | Temp 98.4°F | Ht 78.0 in | Wt 322.1 lb

## 2022-06-09 DIAGNOSIS — Z6838 Body mass index (BMI) 38.0-38.9, adult: Secondary | ICD-10-CM

## 2022-06-09 DIAGNOSIS — I10 Essential (primary) hypertension: Secondary | ICD-10-CM | POA: Diagnosis not present

## 2022-06-09 MED ORDER — WEGOVY 1.7 MG/0.75ML ~~LOC~~ SOAJ: 1.7000 mg | SUBCUTANEOUS | 0 refills | Status: DC

## 2022-06-09 MED ORDER — WEGOVY 1 MG/0.5ML ~~LOC~~ SOAJ: 1.0000 mg | SUBCUTANEOUS | 0 refills | Status: DC

## 2022-06-09 MED ORDER — WEGOVY 0.5 MG/0.5ML ~~LOC~~ SOAJ: 0.5000 mg | SUBCUTANEOUS | 0 refills | Status: DC

## 2022-06-09 NOTE — Patient Instructions (Signed)
Wegovy 0.5 for 4 weeks   Wegovy  for 4 weeks   Wegovy 1.7mg  for 4 weeks   Follow up with me  Then we will do wegovy 2.4mg 

## 2022-06-09 NOTE — Assessment & Plan Note (Signed)
-   pt doing well with wegovy with no side effects. Will order next three doses of wegovy  - pt follow up in 3 months

## 2022-06-09 NOTE — Assessment & Plan Note (Signed)
-   continue bp meds - well controlled today  - will follow up in 3 months and get repeat blood work

## 2022-07-14 ENCOUNTER — Telehealth: Payer: BC Managed Care – PPO | Admitting: Physician Assistant

## 2022-07-14 DIAGNOSIS — L237 Allergic contact dermatitis due to plants, except food: Secondary | ICD-10-CM | POA: Diagnosis not present

## 2022-07-14 MED ORDER — TRIAMCINOLONE ACETONIDE 0.1 % EX CREA: 1.0000 | TOPICAL_CREAM | Freq: Two times a day (BID) | CUTANEOUS | 0 refills | Status: DC

## 2022-07-14 MED ORDER — PREDNISONE 10 MG PO TABS: ORAL_TABLET | ORAL | 0 refills | Status: AC

## 2022-07-14 NOTE — Patient Instructions (Signed)
Velta Addison, thank you for joining Piedad Climes, PA-C for today's virtual visit.  While this provider is not your primary care provider (PCP), if your PCP is located in our provider database this encounter information will be shared with them immediately following your visit.   A [facility] MyChart account gives you access to today's visit and all your visits, tests, and labs performed at [facility] " click here if you don't have a [facility] MyChart account or go to [email or web address]  Consent: (Patient) Dylan Vaughn provided verbal consent for this virtual visit at the beginning of the encounter.  Current Medications:  Current Outpatient Medications:    AMBULATORY NON FORMULARY MEDICATION, Supply ordered: PORTABLE CPAP and other supplies needed (headgear, cushions, filters, heated tuubing and water chamber) Dx: obstructive sleep apnea Settings: auto-titration 5-20 cmH2O (Patient taking differently: 1 application  by Other route as directed. Supply ordered: PORTABLE CPAP and other supplies needed (headgear, cushions, filters, heated tuubing and water chamber) Dx: obstructive sleep apnea Settings: auto-titration 5-20 cmH2O), Disp: 1 Units, Rfl: 99   aspirin EC 81 MG tablet, Take 81 mg by mouth daily., Disp: , Rfl:    ibuprofen (ADVIL) 200 MG tablet, Take 600-800 mg by mouth every 6 (six) hours as needed for moderate pain., Disp: , Rfl:    lisinopril-hydrochlorothiazide (ZESTORETIC) 20-25 MG tablet, TAKE 1 TABLET DAILY, Disp: 90 tablet, Rfl: 3   metoprolol succinate (TOPROL-XL) 50 MG 24 hr tablet, Take 1 tablet (50 mg total) by mouth daily. Patient needs appointment for further refills. 1 st attempt, Disp: 30 tablet, Rfl: 0   OVER THE COUNTER MEDICATION, Take 1 tablet by mouth daily. Taking 1 tab of each version of Juice Plus vitamins / Unknown strength, Disp: , Rfl:    psyllium (METAMUCIL) 58.6 % packet, Take 1 packet by mouth daily., Disp: , Rfl:    rivaroxaban  (XARELTO) 20 MG TABS tablet, Take 1 tablet (20 mg total) by mouth daily with supper., Disp: 30 tablet, Rfl: 3   rosuvastatin (CRESTOR) 10 MG tablet, Take 1 tablet (10 mg total) by mouth daily., Disp: 90 tablet, Rfl: 3   WEGOVY 0.5 MG/0.5ML SOAJ, Inject 0.5 mg into the skin once a week. Use this dose for 1 month (4 shots) and then increase to next higher dose., Disp: 2 mL, Rfl: 0   WEGOVY 1 MG/0.5ML SOAJ, Inject 1 mg into the skin once a week. Use this dose for 1 month (4 shots) and then increase to next higher dose., Disp: 2 mL, Rfl: 0   WEGOVY 1.7 MG/0.75ML SOAJ, Inject 1.7 mg into the skin once a week. Use this dose for 1 month (4 shots) and then increase to next higher dose., Disp: 3 mL, Rfl: 0   Medications ordered in this encounter:  No orders of the defined types were placed in this encounter.    *If you need refills on other medications prior to your next appointment, please contact your pharmacy*  Follow-Up: Call back or seek an in-person evaluation if the symptoms worsen or if the condition fails to improve as anticipated.  [facility] [phone]  Other Instructions Please keep the skin clean and dry. Cool compresses may be beneficial. Start use of over-the-counter witch hazel astringent as discussed. You can apply the topical steroid cream I have prescribed to the more troublesome areas, avoiding use on the face, arm hits or private areas. Take the prednisone as directed. If symptoms or not resolving or you note any  new or worsening symptoms despite treatment, please seek an in person evaluation ASAP.  Feel better soon!   If you have been instructed to have an in-person evaluation today at a local Urgent Care facility, please use the link below. It will take you to a list of all of our available [facility], including address, phone number and hours of operation. Please do not delay care.  [facility]  If you or a family member do not  have a primary care provider, use the link below to schedule a visit and establish care. When you choose a [facility] primary care physician or advanced practice provider, you gain a long-term partner in health. Find a Primary Care Provider  Learn more about [facility] in-office and virtual care options: [facility] - Get Care Now

## 2022-07-14 NOTE — Progress Notes (Signed)
Virtual Visit Consent   Dylan Vaughn, you are scheduled for a virtual visit with a [facility] provider today. Just as with appointments in the office, your consent must be obtained to participate. Your consent will be active for this visit and any virtual visit you may have with one of our providers in the next 365 days. If you have a MyChart account, a copy of this consent can be sent to you electronically.  As this is a virtual visit, video technology does not allow for your provider to perform a traditional examination. This may limit your provider's ability to fully assess your condition. If your provider identifies any concerns that need to be evaluated in person or the need to arrange testing (such as labs, EKG, etc.), we will make arrangements to do so. Although advances in technology are sophisticated, we cannot ensure that it will always work on either your end or our end. If the connection with a video visit is poor, the visit may have to be switched to a telephone visit. With either a video or telephone visit, we are not always able to ensure that we have a secure connection.  By engaging in this virtual visit, you consent to the provision of healthcare and authorize for your insurance to be billed (if applicable) for the services provided during this visit. Depending on your insurance coverage, you may receive a charge related to this service.  I need to obtain your verbal consent now. Are you willing to proceed with your visit today? Dylan Vaughn has provided verbal consent on 07/14/2022 for a virtual visit (video or telephone). Dylan Vaughn, [place]  Date: 07/14/2022 6:06 PM  Virtual Visit via Video Note   I, Dylan Vaughn, connected with  Dylan Vaughn  ([id number], 1970-10-24) on 07/14/22 at  5:45 PM EDT by a video-enabled telemedicine application and verified that I am speaking with the correct person using two identifiers.  Location: Patient: Virtual Visit Location  Patient: Home Provider: Virtual Visit Location Provider: Home Office   I discussed the limitations of evaluation and management by telemedicine and the availability of in person appointments. The patient expressed understanding and agreed to proceed.    History of Present Illness: Dylan Vaughn is a 52 y.o. who identifies as a male who was assigned male at birth, and is being seen today for rash first noted on left arm Tuesday a.m.  Since then rash has been worsening and is spread to right arm and bilateral lower extremities.  Now noticing similar rash of chest.  Notes rash is pruritic and nonpainful.  Denies fever, chills, malaise or fatigue.  Denies changed any soaps lotions or detergents.  Denies use of any chemicals for cleaning or lawn care that he can recall.  Did golf over the past weekend so potentially came in to contact with poison oak/ivy, but unsure.    HPI: HPI  Problems:  Patient Active Problem List   Diagnosis Date Noted   Class 2 severe obesity due to excess calories with serious comorbidity and body mass index (BMI) of 38.0 to 38.9 in adult [facility]) 05/06/2022   Prediabetes 05/06/2022   Decreased sex drive 40/98/1191   Uvulitis 03/31/2021   Obstructive sleep apnea 02/18/2019   Paroxysmal atrial fibrillation (HCC) 02/18/2019   Hypertriglyceridemia 08/21/2018   Impaired glucose tolerance 05/22/2018   Essential hypertension 04/25/2018   Mixed hyperlipidemia 04/25/2018   Diverticular disease 04/25/2018   History of thyroid disorder 04/25/2018   Diverticulitis of colon with bleeding  2000    Allergies:  Allergies  Allergen Reactions   Belladonna Alkaloids Other (See Comments)    Tachycardia -  as a child   Medications:  Current Outpatient Medications:    predniSONE (DELTASONE) 10 MG tablet, Take 4 tablets (40 mg total) by mouth daily with breakfast for 4 days, THEN 3 tablets (30 mg total) daily with breakfast for 4 days, THEN 2 tablets (20 mg total) daily with breakfast for  3 days, THEN 1 tablet (10 mg total) daily with breakfast for 3 days., Disp: 37 tablet, Rfl: 0   triamcinolone cream (KENALOG) 0.1 %, Apply 1 Application topically 2 (two) times daily., Disp: 30 g, Rfl: 0   AMBULATORY NON FORMULARY MEDICATION, Supply ordered: PORTABLE CPAP and other supplies needed (headgear, cushions, filters, heated tuubing and water chamber) Dx: obstructive sleep apnea Settings: auto-titration 5-20 cmH2O (Patient taking differently: 1 application  by Other route as directed. Supply ordered: PORTABLE CPAP and other supplies needed (headgear, cushions, filters, heated tuubing and water chamber) Dx: obstructive sleep apnea Settings: auto-titration 5-20 cmH2O), Disp: 1 Units, Rfl: 99   aspirin EC 81 MG tablet, Take 81 mg by mouth daily., Disp: , Rfl:    ibuprofen (ADVIL) 200 MG tablet, Take 600-800 mg by mouth every 6 (six) hours as needed for moderate pain., Disp: , Rfl:    lisinopril-hydrochlorothiazide (ZESTORETIC) 20-25 MG tablet, TAKE 1 TABLET DAILY, Disp: 90 tablet, Rfl: 3   metoprolol succinate (TOPROL-XL) 50 MG 24 hr tablet, Take 1 tablet (50 mg total) by mouth daily. Patient needs appointment for further refills. 1 st attempt, Disp: 30 tablet, Rfl: 0   OVER THE COUNTER MEDICATION, Take 1 tablet by mouth daily. Taking 1 tab of each version of Juice Plus vitamins / Unknown strength, Disp: , Rfl:    psyllium (METAMUCIL) 58.6 % packet, Take 1 packet by mouth daily., Disp: , Rfl:    rivaroxaban (XARELTO) 20 MG TABS tablet, Take 1 tablet (20 mg total) by mouth daily with supper., Disp: 30 tablet, Rfl: 3   rosuvastatin (CRESTOR) 10 MG tablet, Take 1 tablet (10 mg total) by mouth daily., Disp: 90 tablet, Rfl: 3   WEGOVY 0.5 MG/0.5ML SOAJ, Inject 0.5 mg into the skin once a week. Use this dose for 1 month (4 shots) and then increase to next higher dose., Disp: 2 mL, Rfl: 0   WEGOVY 1 MG/0.5ML SOAJ, Inject 1 mg into the skin once a week. Use this dose for 1 month (4 shots) and then increase  to next higher dose., Disp: 2 mL, Rfl: 0   WEGOVY 1.7 MG/0.75ML SOAJ, Inject 1.7 mg into the skin once a week. Use this dose for 1 month (4 shots) and then increase to next higher dose., Disp: 3 mL, Rfl: 0  Observations/Objective: Patient is well-developed, well-nourished in no acute distress.  Resting comfortably at home.  Head is normocephalic, atraumatic.  No labored breathing. Speech is clear and coherent with logical content.  Patient is alert and oriented at baseline.  Erythematous papulovesicular rash noted of upper extremities and lower extremities bilaterally.  Some of these areas seem more scattered whereas others are linear, raising concern for contact dermatitis  Assessment and Plan: 1. Allergic contact dermatitis due to plants, except food - triamcinolone cream (KENALOG) 0.1 %; Apply 1 Application topically 2 (two) times daily.  Dispense: 30 g; Refill: 0 - predniSONE (DELTASONE) 10 MG tablet; Take 4 tablets (40 mg total) by mouth daily with breakfast for 4 days, THEN 3 tablets (  30 mg total) daily with breakfast for 4 days, THEN 2 tablets (20 mg total) daily with breakfast for 3 days, THEN 1 tablet (10 mg total) daily with breakfast for 3 days.  Dispense: 37 tablet; Refill: 0  Widespread and continues to worsen.  Supportive measures and OTC medications reviewed.  Rx triamcinolone cream to apply as directed to more troublesome areas, avoiding use on the face, axillary region and privates.  Prednisone taper as directed.  Patient to follow-up with PCP if symptoms or not resolving or if there are any new or worsening symptoms despite treatment  Follow Up Instructions: I discussed the assessment and treatment plan with the patient. The patient was provided an opportunity to ask questions and all were answered. The patient agreed with the plan and demonstrated an understanding of the instructions.  A copy of instructions were sent to the patient via MyChart unless otherwise noted below.    The patient was advised to call back or seek an in-person evaluation if the symptoms worsen or if the condition fails to improve as anticipated.  Time:  I spent 10 minutes with the patient via telehealth technology discussing the above problems/concerns.    Dylan Climes, PA-C

## 2022-08-09 DIAGNOSIS — G4733 Obstructive sleep apnea (adult) (pediatric): Secondary | ICD-10-CM | POA: Diagnosis not present

## 2022-09-08 ENCOUNTER — Ambulatory Visit: Payer: BC Managed Care – PPO | Admitting: Family Medicine

## 2022-09-12 ENCOUNTER — Encounter: Payer: Self-pay | Admitting: Family Medicine

## 2022-09-12 ENCOUNTER — Ambulatory Visit (INDEPENDENT_AMBULATORY_CARE_PROVIDER_SITE_OTHER): Payer: BC Managed Care – PPO | Admitting: Family Medicine

## 2022-09-12 VITALS — BP 123/83 | HR 76 | Resp 18 | Ht 78.0 in | Wt 315.2 lb

## 2022-09-12 DIAGNOSIS — M2041 Other hammer toe(s) (acquired), right foot: Secondary | ICD-10-CM | POA: Diagnosis not present

## 2022-09-12 DIAGNOSIS — Z6838 Body mass index (BMI) 38.0-38.9, adult: Secondary | ICD-10-CM

## 2022-09-12 DIAGNOSIS — I10 Essential (primary) hypertension: Secondary | ICD-10-CM | POA: Diagnosis not present

## 2022-09-12 MED ORDER — WEGOVY 2.4 MG/0.75ML ~~LOC~~ SOAJ: 2.4000 mg | SUBCUTANEOUS | 11 refills | Status: DC

## 2022-09-12 NOTE — Patient Instructions (Addendum)
Monitor your blood pressure    How to Treat Hammer Toe Hammer toe can often be treated with non-surgical methods to stretch, strengthen and improve the toe muscles. Our orthopaedic specialists may suggest the following changes in lifestyle and conservative therapies to improve hammer toe:  Changing your footwear: Avoid wearing shoes that are narrow, tight or have a high heel. We recommend shoes that are wider, soft and have plenty of room in the toe area.  Getting custom orthotics (shoe inserts): Custom shoe inserts that support and strengthen muscles in the foot can help alleviate pain caused by hammer toe.  Doing physical therapy: Stretching the affected toe throughout the day can sometimes make a huge difference. Taking anti-inflammatory medication: Sometimes we recommend taking anti-inflammatory medications to help relieve pain and swelling.  Getting a corticosteroid injection into the joints of the toe joint  Buying and using over-the-counter corn pads: Corn pads relieve pressure and irritation caused by friction when wearing shoes.  Hammer Toe Surgery

## 2022-09-12 NOTE — Assessment & Plan Note (Signed)
Patient doing well on Wegovy.  Will go ahead and increase to the 2.4 mg dose and continue.  He will follow-up in 3 months.  I did encourage him to increase his exercise to help promote more weight loss.

## 2022-09-12 NOTE — Assessment & Plan Note (Signed)
On repeat blood pressure patient is normotensive.  Will continue his current medication therapy.  Will obtain labs at next visit.  Patient denies headache, chest pain, shortness of breath or blurry vision

## 2022-09-12 NOTE — Assessment & Plan Note (Signed)
Patient has concerns of hammertoe of the right foot.  I provided him with a handout of some therapy options we can try.  I also did a referral to podiatry

## 2022-09-12 NOTE — Progress Notes (Signed)
Established patient visit   Patient: Dylan Vaughn   DOB: April 19, 1970   52 y.o. Male  MRN: [id number] Visit Date: 09/12/2022  Today's healthcare provider: Charlton Amor, DO   Chief Complaint  Patient presents with   Follow-up    HTN, weight loss med    SUBJECTIVE    Chief Complaint  Patient presents with   Follow-up    HTN, weight loss med   HPI HPI     Follow-up    Additional comments: HTN, weight loss med      Last edited by Roselyn Reef, CMA on 09/12/2022  8:20 AM.     Pt presents to follow up on HTN and weight loss.  HTN Lisinopril-hydrochlorothiazide 20-25mg .   Weight loss Patient is currently on Wegovy 1.7 He has lost about 15 pounds in the last 3 months He has tried to improve his diet and notices that he is eating less  Review of Systems  Constitutional:  Negative for activity change, fatigue and fever.  Respiratory:  Negative for cough and shortness of breath.   Cardiovascular:  Negative for chest pain.  Gastrointestinal:  Negative for abdominal pain.  Genitourinary:  Negative for difficulty urinating.       Current Meds  Medication Sig   AMBULATORY NON FORMULARY MEDICATION Supply ordered: PORTABLE CPAP and other supplies needed (headgear, cushions, filters, heated tuubing and water chamber) Dx: obstructive sleep apnea Settings: auto-titration 5-20 cmH2O (Patient taking differently: 1 application  by Other route as directed. Supply ordered: PORTABLE CPAP and other supplies needed (headgear, cushions, filters, heated tuubing and water chamber) Dx: obstructive sleep apnea Settings: auto-titration 5-20 cmH2O)   aspirin EC 81 MG tablet Take 81 mg by mouth daily.   ibuprofen (ADVIL) 200 MG tablet Take 600-800 mg by mouth every 6 (six) hours as needed for moderate pain.   lisinopril-hydrochlorothiazide (ZESTORETIC) 20-25 MG tablet TAKE 1 TABLET DAILY   metoprolol succinate (TOPROL-XL) 50 MG 24 hr tablet Take 1 tablet (50 mg total) by mouth daily.  Patient needs appointment for further refills. 1 st attempt   OVER THE COUNTER MEDICATION Take 1 tablet by mouth daily. Taking 1 tab of each version of Juice Plus vitamins / Unknown strength   psyllium (METAMUCIL) 58.6 % packet Take 1 packet by mouth daily.   rivaroxaban (XARELTO) 20 MG TABS tablet Take 1 tablet (20 mg total) by mouth daily with supper.   rosuvastatin (CRESTOR) 10 MG tablet Take 1 tablet (10 mg total) by mouth daily.   triamcinolone cream (KENALOG) 0.1 % Apply 1 Application topically 2 (two) times daily.   WEGOVY 2.4 MG/0.75ML SOAJ Inject 2.4 mg into the skin once a week.   [DISCONTINUED] WEGOVY 0.5 MG/0.5ML SOAJ Inject 0.5 mg into the skin once a week. Use this dose for 1 month (4 shots) and then increase to next higher dose.   [DISCONTINUED] WEGOVY 1 MG/0.5ML SOAJ Inject 1 mg into the skin once a week. Use this dose for 1 month (4 shots) and then increase to next higher dose.   [DISCONTINUED] WEGOVY 1.7 MG/0.75ML SOAJ Inject 1.7 mg into the skin once a week. Use this dose for 1 month (4 shots) and then increase to next higher dose.    OBJECTIVE    BP (!) 122/94 (BP Location: Left Arm, Patient Position: Sitting, Cuff Size: Large)   Pulse 75   Resp 18   Ht 6\' 6"  (1.981 m)   Wt (!) 315 lb 4 oz (143 kg)  SpO2 97%   BMI 36.43 kg/m   Physical Exam Vitals and nursing note reviewed.  Constitutional:      General: He is not in acute distress.    Appearance: Normal appearance.  HENT:     Head: Normocephalic and atraumatic.     Right Ear: External ear normal.     Left Ear: External ear normal.     Nose: Nose normal.  Eyes:     Conjunctiva/sclera: Conjunctivae normal.  Cardiovascular:     Rate and Rhythm: Normal rate and regular rhythm.  Pulmonary:     Effort: Pulmonary effort is normal.     Breath sounds: Normal breath sounds.  Neurological:     General: No focal deficit present.     Mental Status: He is alert and oriented to person, place, and time.  Psychiatric:         Mood and Affect: Mood normal.        Behavior: Behavior normal.        Thought Content: Thought content normal.        Judgment: Judgment normal.      ASSESSMENT & PLAN    Problem List Items Addressed This Visit       Cardiovascular and Mediastinum   Essential hypertension - Primary    On repeat blood pressure patient is normotensive.  Will continue his current medication therapy.  Will obtain labs at next visit.  Patient denies headache, chest pain, shortness of breath or blurry vision        Musculoskeletal and Integument   Hammer toe of right foot    Patient has concerns of hammertoe of the right foot.  I provided him with a handout of some therapy options we can try.  I also did a referral to podiatry      Relevant Orders   Ambulatory referral to Podiatry     Other   Class 2 severe obesity due to excess calories with serious comorbidity and body mass index (BMI) of 38.0 to 38.9 in adult [facility])    Patient doing well on Wegovy.  Will go ahead and increase to the 2.4 mg dose and continue.  He will follow-up in 3 months.  I did encourage him to increase his exercise to help promote more weight loss.      Relevant Medications   WEGOVY 2.4 MG/0.75ML SOAJ    Return in about 3 months (around 12/13/2022).      Meds ordered this encounter  Medications   WEGOVY 2.4 MG/0.75ML SOAJ    Sig: Inject 2.4 mg into the skin once a week.    Dispense:  3 mL    Refill:  11    Orders Placed This Encounter  Procedures   Ambulatory referral to Podiatry    Referral Priority:   Routine    Referral Type:   Consultation    Referral Reason:   Specialty Services Required    Requested Specialty:   Podiatry    Number of Visits Requested:   1     Charlton Amor, DO  [facility] at [facility] [phone] (phone) [phone] (fax)  [facility]

## 2022-09-13 ENCOUNTER — Telehealth: Payer: Self-pay | Admitting: Family Medicine

## 2022-09-13 ENCOUNTER — Other Ambulatory Visit: Payer: Self-pay | Admitting: Family Medicine

## 2022-09-13 ENCOUNTER — Encounter: Payer: Self-pay | Admitting: Family Medicine

## 2022-09-13 DIAGNOSIS — I10 Essential (primary) hypertension: Secondary | ICD-10-CM

## 2022-09-13 MED ORDER — LISINOPRIL-HYDROCHLOROTHIAZIDE 20-25 MG PO TABS: ORAL_TABLET | ORAL | 3 refills | Status: DC

## 2022-09-13 NOTE — Telephone Encounter (Signed)
Patient called requesting refills on lisinopril-hydrochlorothiazide 20-25 mg [facility] [address] in [place] He is going out of town and needs them

## 2022-09-13 NOTE — Telephone Encounter (Signed)
Pt called. [facility] will not refill 30 day supply Lisinopril hydrochlorothiazide because it is too early.  He usually gets his med via [facility] order. He did not realize mail order was not on auto refill. He wants to know if we can speak to the pharmacy. He is leaving town tomorrow and needs his med.

## 2022-09-14 NOTE — Telephone Encounter (Signed)
Patient used a GoodRx coupon. He has his medication.

## 2022-11-07 DIAGNOSIS — G4733 Obstructive sleep apnea (adult) (pediatric): Secondary | ICD-10-CM | POA: Diagnosis not present

## 2022-11-27 ENCOUNTER — Encounter: Payer: Self-pay | Admitting: Family Medicine

## 2022-11-27 DIAGNOSIS — E66812 Obesity, class 2: Secondary | ICD-10-CM

## 2022-11-28 MED ORDER — WEGOVY 2.4 MG/0.75ML ~~LOC~~ SOAJ: 2.4000 mg | SUBCUTANEOUS | 3 refills | Status: AC

## 2022-12-20 ENCOUNTER — Ambulatory Visit: Payer: BC Managed Care – PPO | Admitting: Family Medicine

## 2022-12-20 ENCOUNTER — Encounter: Payer: Self-pay | Admitting: Family Medicine

## 2022-12-20 VITALS — BP 118/79 | HR 75 | Ht 78.0 in | Wt 305.8 lb

## 2022-12-20 DIAGNOSIS — Z23 Encounter for immunization: Secondary | ICD-10-CM | POA: Diagnosis not present

## 2022-12-20 DIAGNOSIS — E66812 Obesity, class 2: Secondary | ICD-10-CM | POA: Diagnosis not present

## 2022-12-20 DIAGNOSIS — Z6838 Body mass index (BMI) 38.0-38.9, adult: Secondary | ICD-10-CM

## 2022-12-20 DIAGNOSIS — R7303 Prediabetes: Secondary | ICD-10-CM

## 2022-12-20 DIAGNOSIS — I10 Essential (primary) hypertension: Secondary | ICD-10-CM | POA: Diagnosis not present

## 2022-12-20 LAB — POCT GLYCOSYLATED HEMOGLOBIN (HGB A1C): Hemoglobin A1C: 5.7 % — AB (ref 4.0–5.6)

## 2022-12-20 NOTE — Assessment & Plan Note (Signed)
Weight has been good, weight loss is significant for 10lbs - discussed encouraging more diet and exercise

## 2022-12-20 NOTE — Progress Notes (Signed)
Established patient visit   Patient: Dylan Vaughn   DOB: [birth year]   52 y.o. Male  MRN: [id number] Visit Date: 12/20/2022  Today's healthcare provider: Charlton Amor, DO   Chief Complaint  Patient presents with   Medical Management of Chronic Issues    DM last A1C 5.7    SUBJECTIVE    Chief Complaint  Patient presents with   Medical Management of Chronic Issues    DM last A1C 5.7   HPI HPI     Medical Management of Chronic Issues    Additional comments: DM last A1C 5.7      Last edited by Roselyn Reef, CMA on 12/20/2022 10:23 AM.       Pt here to follow up.   Weight management - on wegovy 2.4mg   - has lost 10lbs since last visit   HTN - on lisinopril-hydrochlorothiazide 20-25mg   - metoprolol 50mg  xr  - BP well controlled today at 118/79  Prediabetes - diet and exercise management    Review of Systems  Constitutional:  Negative for activity change, fatigue and fever.  Respiratory:  Negative for cough and shortness of breath.   Cardiovascular:  Negative for chest pain.  Gastrointestinal:  Negative for abdominal pain.  Genitourinary:  Negative for difficulty urinating.       Current Meds  Medication Sig   AMBULATORY NON FORMULARY MEDICATION Supply ordered: PORTABLE CPAP and other supplies needed (headgear, cushions, filters, heated tuubing and water chamber) Dx: obstructive sleep apnea Settings: auto-titration 5-20 cmH2O (Patient taking differently: 1 application  by Other route as directed. Supply ordered: PORTABLE CPAP and other supplies needed (headgear, cushions, filters, heated tuubing and water chamber) Dx: obstructive sleep apnea Settings: auto-titration 5-20 cmH2O)   aspirin EC 81 MG tablet Take 81 mg by mouth daily.   ibuprofen (ADVIL) 200 MG tablet Take 600-800 mg by mouth every 6 (six) hours as needed for moderate pain.   lisinopril-hydrochlorothiazide (ZESTORETIC) 20-25 MG tablet TAKE 1 TABLET DAILY   metoprolol succinate  (TOPROL-XL) 50 MG 24 hr tablet Take 1 tablet (50 mg total) by mouth daily. Patient needs appointment for further refills. 1 st attempt   OVER THE COUNTER MEDICATION Take 1 tablet by mouth daily. Taking 1 tab of each version of Juice Plus vitamins / Unknown strength   psyllium (METAMUCIL) 58.6 % packet Take 1 packet by mouth daily.   rivaroxaban (XARELTO) 20 MG TABS tablet Take 1 tablet (20 mg total) by mouth daily with supper.   rosuvastatin (CRESTOR) 10 MG tablet Take 1 tablet (10 mg total) by mouth daily.   WEGOVY 2.4 MG/0.75ML SOAJ Inject 2.4 mg into the skin once a week.    OBJECTIVE    BP 118/79 (BP Location: Left Arm, Patient Position: Sitting, Cuff Size: Large)   Pulse 75   Ht 6\' 6"  (1.981 m)   Wt (!) 305 lb 12 oz (138.7 kg)   SpO2 99%   BMI 35.33 kg/m   Physical Exam Vitals and nursing note reviewed.  Constitutional:      General: He is not in acute distress.    Appearance: Normal appearance.  HENT:     Head: Normocephalic and atraumatic.     Right Ear: External ear normal.     Left Ear: External ear normal.     Nose: Nose normal.  Eyes:     Conjunctiva/sclera: Conjunctivae normal.  Cardiovascular:     Rate and Rhythm: Normal rate and regular rhythm.  Pulmonary:  Effort: Pulmonary effort is normal.     Breath sounds: Normal breath sounds.  Neurological:     General: No focal deficit present.     Mental Status: He is alert and oriented to person, place, and time.  Psychiatric:        Mood and Affect: Mood normal.        Behavior: Behavior normal.        Thought Content: Thought content normal.        Judgment: Judgment normal.        ASSESSMENT & PLAN    Problem List Items Addressed This Visit       Cardiovascular and Mediastinum   Essential hypertension    - BP well controlled today  - continue medication       Relevant Orders   Basic Metabolic Panel (BMET)     Other   Class 2 severe obesity due to excess calories with serious comorbidity and  body mass index (BMI) of 38.0 to 38.9 in adult (HCC) - Primary    Weight has been good, weight loss is significant for 10lbs - discussed encouraging more diet and exercise       Prediabetes    POC A1c 5.7  - continue diet and exercise modification      Relevant Orders   POCT HgB A1C (Completed)    Return in about 7 months (around 07/20/2023) for HTN, wegovy, and prediabetes follow up.      No orders of the defined types were placed in this encounter.   Orders Placed This Encounter  Procedures   Basic Metabolic Panel (BMET)   POCT HgB A1C     Charlton Amor, DO  [facility] at [facility] [phone] (phone) [phone] (fax)  [facility]

## 2022-12-20 NOTE — Assessment & Plan Note (Signed)
-   BP well controlled today  - continue medication

## 2022-12-20 NOTE — Assessment & Plan Note (Signed)
POC A1c 5.7  - continue diet and exercise modification

## 2022-12-21 LAB — BASIC METABOLIC PANEL
BUN/Creatinine Ratio: 8 — ABNORMAL LOW (ref 9–20)
BUN: 7 mg/dL (ref 6–24)
CO2: 25 mmol/L (ref 20–29)
Calcium: 9.5 mg/dL (ref 8.7–10.2)
Chloride: 99 mmol/L (ref 96–106)
Creatinine, Ser: 0.84 mg/dL (ref 0.76–1.27)
Glucose: 89 mg/dL (ref 70–99)
Potassium: 3.9 mmol/L (ref 3.5–5.2)
Sodium: 141 mmol/L (ref 134–144)
eGFR: 104 mL/min/{1.73_m2} (ref >59)

## 2023-02-06 DIAGNOSIS — G4733 Obstructive sleep apnea (adult) (pediatric): Secondary | ICD-10-CM | POA: Diagnosis not present

## 2023-02-09 NOTE — Progress Notes (Signed)
   02/09/2023  Patient ID: Dylan Vaughn, male   DOB: 12/23/1970, 52 y.o.   MRN: [id number]  Incoming fax from [facility] stating patient's prescription for [facility] 2.4mg  weekly is requiring a prior authorization.  Of note, it appears this has been filled successfully since August; and the last refill was for a 3 month supply in October.  It appears the initial PA for this medication expired on 10/18 right after last refill was filled.  Prior authorization request sent to [facility] with supporting clinical information and documentation.    Prior authorization has been approved.  Lenna Gilford, PharmD, DPLA

## 2023-03-01 ENCOUNTER — Other Ambulatory Visit: Payer: Self-pay | Admitting: Podiatry

## 2023-03-01 DIAGNOSIS — M79671 Pain in right foot: Secondary | ICD-10-CM

## 2023-03-01 NOTE — Progress Notes (Unsigned)
 xr

## 2023-03-02 ENCOUNTER — Encounter: Payer: Self-pay | Admitting: Podiatry

## 2023-03-02 ENCOUNTER — Ambulatory Visit: Payer: BC Managed Care – PPO | Admitting: Podiatry

## 2023-03-02 ENCOUNTER — Ambulatory Visit (INDEPENDENT_AMBULATORY_CARE_PROVIDER_SITE_OTHER): Payer: BC Managed Care – PPO

## 2023-03-02 DIAGNOSIS — M2041 Other hammer toe(s) (acquired), right foot: Secondary | ICD-10-CM

## 2023-03-02 DIAGNOSIS — M19071 Primary osteoarthritis, right ankle and foot: Secondary | ICD-10-CM | POA: Diagnosis not present

## 2023-03-02 DIAGNOSIS — M79671 Pain in right foot: Secondary | ICD-10-CM | POA: Diagnosis not present

## 2023-03-02 DIAGNOSIS — M2011 Hallux valgus (acquired), right foot: Secondary | ICD-10-CM

## 2023-03-02 DIAGNOSIS — M7662 Achilles tendinitis, left leg: Secondary | ICD-10-CM

## 2023-03-02 DIAGNOSIS — M7731 Calcaneal spur, right foot: Secondary | ICD-10-CM | POA: Diagnosis not present

## 2023-03-02 NOTE — Progress Notes (Signed)
  Subjective:  Patient ID: Dylan Vaughn, male    DOB: 1971-01-29,   MRN: [id number]  No chief complaint on file.   53 y.o. male presents for concern of hammertoe on his right foot. Relates the right second toe on the top has been starting to bother him over the past several months. Relates in certain shoes it will get irritated and when pressure on the top of his foot like when kneeling he notices it. Here to discuss options. Also relates some continued pain on and off with his left achilles. Relates PT did help but still has trouble.   . Denies any other pedal complaints. Denies n/v/f/c.   Past Medical History:  Diagnosis Date   Blood transfusion without reported diagnosis    2000 with colectomy    Diverticular disease 04/25/2018   Diverticulitis of colon with bleeding 2000   Elevated hemoglobin A1c measurement 04/30/2018   Essential hypertension 04/25/2018   High blood pressure    High cholesterol    History of thyroid  disorder 04/25/2018   S/p biopsy WNL   Hypertriglyceridemia 08/21/2018   Impaired glucose tolerance 05/22/2018   Per record review, fasting Glc elevated but A1C 12/2016 was 5.5   Mixed hyperlipidemia 04/25/2018   Obstructive sleep apnea 02/18/2019   Paroxysmal atrial fibrillation (HCC) 02/18/2019    Objective:  Physical Exam: Vascular: DP/PT pulses 2/4 bilateral. CFT <3 seconds. Normal hair growth on digits. No edema.  Skin. No lacerations or abrasions bilateral feet.  Musculoskeletal: MMT 5/5 bilateral lower extremities in DF, PF, Inversion and Eversion. Deceased ROM in DF of ankle joint. Mildly tender around achilles insertion on left. Tender to dorsum of right second digit with semi rigid deformity noted on second digit and flexible deformity noted to 2-5.  Neurological: Sensation intact to light touch.   Assessment:   1. Hammer toe of right foot   2. Tendonitis, Achilles, left      Plan:  Patient was evaluated and treated and all questions answered. -X-rays  reviewed. Noted hammered digits 2-5 on right.  -Educated on hammertoes and treatment options  -Discussed padding including toe caps and crest pads.  -Discussed need for potential surgery if pain does not improved. Did discuss flexor tenotomy given some flexibility of the toe and patient elects to proceed with this today. Proecedure below.  -Did breifely discuss left achilles is still causing some trouble on and off. Discussed possible surgical intervention in the future. Did discuss CMO and patient interested in this. Will get scheduled.  -Patient to follow-up as needed. Discussed calling if any changes or increased pain.   Procedure: Flexor Tenotomy Indication for Procedure: toe with semi-reducible hammertoe with distal tip ulceration. Flexor tenotomy indicated to alleviate contracture, reduce pressure, and enhance healing of the ulceration. Location: right second digit  Anesthesia: Lidocaine 2% plain; 2mL digital block Instrumentation: 18 gauge needle  Technique: The toe was anesthetized as above and prepped in the usual fashion. The toe was exanquinated and a tourniquet was secured at the base of the toe. A 18 gauge needle was then used to make a transverse incision over the plantar aspect of the distal interphalangeal joint. The flexor tendon was incised with noted release of the hammertoe deformity. The incision was then irrigated and dressed with sterile dressing. Patient tolerated the procedure well. Dressing: Dry, sterile, compression dressing. Disposition: Patient tolerated procedure well. Patient to return in 1 week for follow-up.    Asberry Failing, DPM

## 2023-03-07 ENCOUNTER — Encounter: Payer: Self-pay | Admitting: Podiatry

## 2023-03-09 ENCOUNTER — Ambulatory Visit: Payer: BC Managed Care – PPO | Admitting: Podiatry

## 2023-05-01 ENCOUNTER — Other Ambulatory Visit: Payer: Self-pay | Admitting: Family Medicine

## 2023-05-01 DIAGNOSIS — I10 Essential (primary) hypertension: Secondary | ICD-10-CM

## 2023-05-08 DIAGNOSIS — G4733 Obstructive sleep apnea (adult) (pediatric): Secondary | ICD-10-CM | POA: Diagnosis not present

## 2023-05-23 ENCOUNTER — Other Ambulatory Visit: Payer: Self-pay | Admitting: Family Medicine

## 2023-05-23 DIAGNOSIS — I10 Essential (primary) hypertension: Secondary | ICD-10-CM

## 2023-06-29 ENCOUNTER — Encounter: Payer: Self-pay | Admitting: Family Medicine

## 2023-07-20 ENCOUNTER — Ambulatory Visit: Payer: BC Managed Care – PPO | Admitting: Family Medicine

## 2023-08-07 DIAGNOSIS — G4733 Obstructive sleep apnea (adult) (pediatric): Secondary | ICD-10-CM | POA: Diagnosis not present

## 2023-08-08 ENCOUNTER — Ambulatory Visit
Admission: RE | Admit: 2023-08-08 | Discharge: 2023-08-08 | Disposition: A | Discharge status: Home / Self Care | Source: Ambulatory Visit | Attending: Family Medicine | Admitting: Family Medicine

## 2023-08-08 VITALS — BP 126/83 | HR 89 | Temp 99.0°F | Resp 18 | Ht 78.0 in | Wt 322.0 lb

## 2023-08-08 DIAGNOSIS — T50905A Adverse effect of unspecified drugs, medicaments and biological substances, initial encounter: Secondary | ICD-10-CM

## 2023-08-08 DIAGNOSIS — Z6837 Body mass index (BMI) 37.0-37.9, adult: Secondary | ICD-10-CM | POA: Insufficient documentation

## 2023-08-08 DIAGNOSIS — R112 Nausea with vomiting, unspecified: Secondary | ICD-10-CM | POA: Diagnosis not present

## 2023-08-08 MED ORDER — ONDANSETRON HCL 8 MG PO TABS: 8.0000 mg | ORAL_TABLET | Freq: Three times a day (TID) | ORAL | 0 refills | Status: AC | PRN

## 2023-08-08 NOTE — ED Provider Notes (Signed)
 [name]    CSN: [id number] Arrival date & time: 08/08/23  1021      History   Chief Complaint Chief Complaint  Patient presents with   Nausea    Been vomiting hourly since 9am yesterday. - Entered by patient    HPI Dylan Vaughn is a 53 y.o. male.   Patient states he was using Wegovy  since last fall in order to lose weight.  He was successful in losing some weight but did not like the way it made him feel.  He did have some nausea on the medication.  Occasional dry heaves.  He decided to stop the medication, but within 3 weeks he was gaining weight again.  He decided to resume his medication and started back on his full dose.  He has been having vomiting since that time.  Nausea.  No recent travel.  No food poisoning.  No one else at home is sick.  No fever chills or bodyaches to indicate infection     Past Medical History:  Diagnosis Date   Blood transfusion without reported diagnosis    2000 with colectomy    Diverticular disease 04/25/2018   Diverticulitis of colon with bleeding 2000   Elevated hemoglobin A1c measurement 04/30/2018   Essential hypertension 04/25/2018   High blood pressure    High cholesterol    History of thyroid  disorder 04/25/2018   S/p biopsy WNL   Hypertriglyceridemia 08/21/2018   Impaired glucose tolerance 05/22/2018   Per record review, fasting Glc elevated but A1C 12/2016 was 5.5   Mixed hyperlipidemia 04/25/2018   Obstructive sleep apnea 02/18/2019   Paroxysmal atrial fibrillation (HCC) 02/18/2019    Patient Active Problem List   Diagnosis Date Noted   BMI 37.0-37.9, adult 08/08/2023   Hammer toe of right foot 09/12/2022   Prediabetes 05/06/2022   Decreased sex drive 82/95/6213   Obstructive sleep apnea 02/18/2019   Paroxysmal atrial fibrillation (HCC) 02/18/2019   Hypertriglyceridemia 08/21/2018   Impaired glucose tolerance 05/22/2018   Essential hypertension 04/25/2018   Mixed hyperlipidemia 04/25/2018   Diverticular disease  04/25/2018   History of thyroid  disorder 04/25/2018   Diverticulitis of colon with bleeding 2000    Past Surgical History:  Procedure Laterality Date   COLECTOMY  2000   Ruptured diverticulum- ascending and transverse colon removed    COLONOSCOPY  2000   ruptured diverticulum    EYE SURGERY     1978/1988   INGUINAL HERNIA REPAIR Left 1976   POSTERIOR CRUCIATE LIGAMENT RECONSTRUCTION Left 1991   UPPER GASTROINTESTINAL ENDOSCOPY     2000       Home Medications    Prior to Admission medications   Medication Sig Start Date End Date Taking? Authorizing Provider  AMBULATORY NON FORMULARY MEDICATION Supply ordered: PORTABLE CPAP and other supplies needed (headgear, cushions, filters, heated tuubing and water chamber) Dx: obstructive sleep apnea Settings: auto-titration 5-20 cmH2O Patient taking differently: 1 application  by Other route as directed. Supply ordered: PORTABLE CPAP and other supplies needed (headgear, cushions, filters, heated tuubing and water chamber) Dx: obstructive sleep apnea Settings: auto-titration 5-20 cmH2O 09/24/20  Yes Alexander, Natalie, DO  aspirin EC 81 MG tablet Take 81 mg by mouth daily.   Yes [provider]  ibuprofen (ADVIL) 200 MG tablet Take 600-800 mg by mouth every 6 (six) hours as needed for moderate pain.   Yes [provider]  lisinopril -hydrochlorothiazide  (ZESTORETIC ) 20-25 MG tablet TAKE 1 TABLET DAILY 05/23/23  Yes Josepha Nickels, DO  metoprolol  succinate (TOPROL -XL) 50 MG 24 hr tablet Take 1 tablet (50 mg total) by mouth daily. Patient needs appointment for further refills. 1 st attempt 05/11/22  Yes Krasowski, Raekwon J, MD  ondansetron (ZOFRAN) 8 MG tablet Take 1 tablet (8 mg total) by mouth every 8 (eight) hours as needed for nausea or vomiting. 08/08/23  Yes Stephany Ehrich, MD  OVER THE COUNTER MEDICATION Take 1 tablet by mouth daily. Taking 1 tab of each version of Juice Plus vitamins / Unknown strength   Yes [provider]  psyllium (METAMUCIL) 58.6 % packet Take 1 packet by mouth daily.   Yes [provider]  rivaroxaban  (XARELTO ) 20 MG TABS tablet Take 1 tablet (20 mg total) by mouth daily with supper. 12/20/19  Yes Krasowski, Rooney J, MD  rosuvastatin  (CRESTOR ) 10 MG tablet TAKE 1 TABLET DAILY 05/01/23  Yes Breeback, Jade L, PA-C  WEGOVY  2.4 MG/0.75ML SOAJ Inject 2.4 mg into the skin once a week. 11/28/22  Yes Josepha Nickels, DO    Family History Family History  Problem Relation Age of Onset   High blood pressure Father    Heart attack Father 26   Colon polyps Sister    Heart attack Paternal Grandfather 60   Colon cancer Neg Hx    Esophageal cancer Neg Hx    Rectal cancer Neg Hx    Stomach cancer Neg Hx     Social History Social History   Tobacco Use   Smoking status: Never   Smokeless tobacco: Never  Vaping Use   Vaping status: Never Used  Substance Use Topics   Alcohol use: Yes    Alcohol/week: 6.0 standard drinks of alcohol    Types: 6 Standard drinks or equivalent per week    Comment: socially    Drug use: Never     Allergies   Belladonna alkaloids   Review of Systems Review of Systems  See HPI Physical Exam Triage Vital Signs ED Triage Vitals  Encounter Vitals Group     BP 08/08/23 1035 126/83     Girls Systolic BP Percentile --      Girls Diastolic BP Percentile --      Boys Systolic BP Percentile --      Boys Diastolic BP Percentile --      Pulse Rate 08/08/23 1035 89     Resp 08/08/23 1035 18     Temp 08/08/23 1035 99 F (37.2 C)     Temp Source 08/08/23 1035 Oral     SpO2 08/08/23 1035 95 %     Weight 08/08/23 1037 (!) 322 lb (146.1 kg)     Height 08/08/23 1037 6' 6 (1.981 m)     Head Circumference --      Peak Flow --      Pain Score 08/08/23 1037 0     Pain Loc --      Pain Education --      Exclude from Growth Chart --    No data found.  Updated Vital Signs BP 126/83 (BP Location: Right Arm)   Pulse 89   Temp 99 F (37.2  C) (Oral)   Resp 18   Ht 6' 6 (1.981 m)   Wt (!) 146.1 kg   SpO2 95%   BMI 37.21 kg/m    Physical Exam Constitutional:      General: He is not in acute distress.    Appearance: He is well-developed.     Comments: Overweight  HENT:  Head: Normocephalic and atraumatic.   Eyes:     Conjunctiva/sclera: Conjunctivae normal.     Pupils: Pupils are equal, round, and reactive to light.    Cardiovascular:     Rate and Rhythm: Normal rate and regular rhythm.     Heart sounds: Normal heart sounds.  Pulmonary:     Effort: Pulmonary effort is normal. No respiratory distress.     Breath sounds: Normal breath sounds.  Abdominal:     General: There is no distension.     Palpations: Abdomen is soft.     Tenderness: There is no abdominal tenderness.   Musculoskeletal:        General: Normal range of motion.     Cervical back: Normal range of motion.   Skin:    General: Skin is warm and dry.   Neurological:     Mental Status: He is alert.      UC Treatments / Results  Labs (all labs ordered are listed, but only abnormal results are displayed) Labs Reviewed - No data to display  EKG   Radiology No results found.  Procedures Procedures (including critical care time)  Medications Ordered in UC Medications - No data to display  Initial Impression / Assessment and Plan / UC Course  I have reviewed the triage vital signs and the nursing notes.  Pertinent labs & imaging results that were available during my care of the patient were reviewed by me and considered in my medical decision making (see chart for details).     Final Clinical Impressions(s) / UC Diagnoses   Final diagnoses:  Medication side effect, initial encounter  Nausea and vomiting, unspecified vomiting type     Discharge Instructions      If you decide to restart your Wegovy , be sure to give yourself a reduced dose Take Zofran 2-3 times a day as needed for nausea Drink lots of fluids.   Prevent dehydration. May resume bland diet when tolerated.  Frequent small meals See your doctor in follow-up   ED Prescriptions     Medication Sig Dispense Auth. Provider   ondansetron (ZOFRAN) 8 MG tablet Take 1 tablet (8 mg total) by mouth every 8 (eight) hours as needed for nausea or vomiting. 20 tablet Stephany Ehrich, MD      PDMP not reviewed this encounter.   Stephany Ehrich, MD 08/08/23 1057

## 2023-08-08 NOTE — Discharge Instructions (Signed)
 If you decide to restart your Wegovy , be sure to give yourself a reduced dose Take Zofran 2-3 times a day as needed for nausea Drink lots of fluids.  Prevent dehydration. May resume bland diet when tolerated.  Frequent small meals See your doctor in follow-up

## 2023-08-08 NOTE — ED Triage Notes (Signed)
 Patient states he began vomiting yesterday morning at work.  Every hour since yesterday.  Patient restarted his Wegovy  2.4mg  yesterday after being off of it for 3 weeks.  Afebrile.  Nausea.

## 2023-10-24 ENCOUNTER — Encounter: Payer: Self-pay | Admitting: Sports Medicine

## 2023-11-13 DIAGNOSIS — G4733 Obstructive sleep apnea (adult) (pediatric): Secondary | ICD-10-CM | POA: Diagnosis not present

## 2024-02-12 DIAGNOSIS — G4733 Obstructive sleep apnea (adult) (pediatric): Secondary | ICD-10-CM | POA: Diagnosis not present

## 2024-04-30 ENCOUNTER — Ambulatory Visit (HOSPITAL_BASED_OUTPATIENT_CLINIC_OR_DEPARTMENT_OTHER): Admitting: Cardiology
